# Patient Record
Sex: Female | Born: 1965
Health system: Southern US, Community
[De-identification: ages and names within clinical notes are randomized; demographics above are authoritative.]

## PROBLEM LIST (undated history)

## (undated) DIAGNOSIS — M1990 Unspecified osteoarthritis, unspecified site: Secondary | ICD-10-CM

## (undated) DIAGNOSIS — E7800 Pure hypercholesterolemia, unspecified: Secondary | ICD-10-CM

## (undated) DIAGNOSIS — L409 Psoriasis, unspecified: Secondary | ICD-10-CM

## (undated) DIAGNOSIS — E119 Type 2 diabetes mellitus without complications: Secondary | ICD-10-CM

## (undated) DIAGNOSIS — I10 Essential (primary) hypertension: Secondary | ICD-10-CM

## (undated) HISTORY — PX: ABDOMINAL HYSTERECTOMY: SHX81

---

## 2008-12-11 ENCOUNTER — Ambulatory Visit: Payer: Self-pay | Admitting: Family

## 2008-12-11 DIAGNOSIS — E785 Hyperlipidemia, unspecified: Secondary | ICD-10-CM | POA: Insufficient documentation

## 2008-12-11 DIAGNOSIS — E669 Obesity, unspecified: Secondary | ICD-10-CM | POA: Insufficient documentation

## 2008-12-11 DIAGNOSIS — K644 Residual hemorrhoidal skin tags: Secondary | ICD-10-CM | POA: Insufficient documentation

## 2008-12-11 DIAGNOSIS — M7020 Olecranon bursitis, unspecified elbow: Secondary | ICD-10-CM | POA: Insufficient documentation

## 2008-12-11 DIAGNOSIS — I10 Essential (primary) hypertension: Secondary | ICD-10-CM | POA: Insufficient documentation

## 2008-12-11 DIAGNOSIS — E119 Type 2 diabetes mellitus without complications: Secondary | ICD-10-CM | POA: Insufficient documentation

## 2008-12-11 DIAGNOSIS — R300 Dysuria: Secondary | ICD-10-CM | POA: Insufficient documentation

## 2008-12-11 LAB — CONVERTED CEMR LAB
ALT: 14 units/L (ref 0–35)
AST: 13 units/L (ref 0–37)
Basophils Relative: 0 % (ref 0–1)
Bilirubin Urine: NEGATIVE
CO2: 19 meq/L (ref 19–32)
Chloride: 105 meq/L (ref 96–112)
Glucose, Bld: 105 mg/dL — ABNORMAL HIGH (ref 70–99)
Hemoglobin: 13.2 g/dL (ref 12.0–15.0)
Ketones, ur: NEGATIVE mg/dL
LDL Cholesterol: 69 mg/dL (ref 0–99)
Lymphocytes Relative: 24 % (ref 12–46)
Lymphs Abs: 2.9 10*3/uL (ref 0.7–4.0)
MCHC: 32.8 g/dL (ref 30.0–36.0)
Monocytes Absolute: 0.8 10*3/uL (ref 0.1–1.0)
Monocytes Relative: 7 % (ref 3–12)
Neutro Abs: 8 10*3/uL — ABNORMAL HIGH (ref 1.7–7.7)
Potassium: 4 meq/L (ref 3.5–5.3)
Protein, ur: NEGATIVE mg/dL
RBC: 4.73 M/uL (ref 3.87–5.11)
Sodium: 137 meq/L (ref 135–145)
Total CHOL/HDL Ratio: 3.5
Urine Glucose: NEGATIVE mg/dL
Urobilinogen, UA: 0.2 (ref 0.0–1.0)
VLDL: 36 mg/dL (ref 0–40)

## 2008-12-12 ENCOUNTER — Encounter: Payer: Self-pay | Admitting: Family

## 2008-12-12 ENCOUNTER — Encounter (INDEPENDENT_AMBULATORY_CARE_PROVIDER_SITE_OTHER): Payer: Self-pay | Admitting: *Deleted

## 2008-12-18 ENCOUNTER — Encounter: Payer: Self-pay | Admitting: Internal Medicine

## 2008-12-19 ENCOUNTER — Ambulatory Visit: Payer: Self-pay | Admitting: Diagnostic Radiology

## 2008-12-19 ENCOUNTER — Ambulatory Visit (HOSPITAL_BASED_OUTPATIENT_CLINIC_OR_DEPARTMENT_OTHER): Admission: RE | Admit: 2008-12-19 | Discharge: 2008-12-19 | Payer: Self-pay | Admitting: Internal Medicine

## 2008-12-21 ENCOUNTER — Encounter: Admission: RE | Admit: 2008-12-21 | Discharge: 2008-12-26 | Payer: Self-pay | Admitting: Orthopedic Surgery

## 2009-05-10 ENCOUNTER — Telehealth: Payer: Self-pay | Admitting: Family

## 2009-05-29 ENCOUNTER — Ambulatory Visit: Payer: Self-pay | Admitting: Internal Medicine

## 2010-03-12 NOTE — Progress Notes (Signed)
Summary: refill-simvastatin, appt?  Phone Note Refill Request Message from:  Fax from Pharmacy on May 10, 2009 8:12 AM  Refills Requested: Medication #1:  SIMVASTATIN 20 MG TABS once daily   Dosage confirmed as above?Dosage Confirmed   Brand Name Necessary? No   Supply Requested: 1 month   Last Refilled: [facility] [address] [place] [phone] FAX [phone]   Method Requested: Electronic Next Appointment Scheduled: NONE Initial call taken by: Roselle Locus,  May 10, 2009 8:13 AM  Follow-up for Phone Call        Pt. does not have a f/u scheduled. When do you want to see her again?  I gave her a 30 day supply on simvastatin.  Mervin Kung CMA  May 10, 2009 1:00 PM   Additional Follow-up for Phone Call Additional follow up Details #1::        she should follow up soon pls.  We need to check on her BP.    Additional Follow-up for Phone Call Additional follow up Details #2::    Unable to reach pt. at home, unable to leave message.  Advised pharmacist to leave note for pt. to call our office.  Mervin Kung CMA  May 11, 2009 8:28 AM   Spoke to pt. and scheduled f/u of BP on 05/16/09 with David Towson @ 9:30. Nicki Guadalajara Fergerson CMA  May 14, 2009 10:52 AM   Prescriptions: SIMVASTATIN 20 MG TABS (SIMVASTATIN) once daily  #30 x 0   Entered by:   Mervin Kung CMA   Authorized by:   Lemont Fillers FNP   Signed by:   Mervin Kung CMA on 05/10/2009   Method used:   [facility]ronically to        [facility] [phone]* (retail)       [address]       [place], [place]  [address]       Ph: [phone] or [phone]       Fax: [phone]   RxID:   [id number]   Current Allergies: No known allergies

## 2010-09-28 IMAGING — MG MM DIGITAL SCREENING
5 series · 5 of 5 positions shown · non-contrast
Comparison: none

DG SCREEN MAMMOGRAM BILATERAL
Bilateral CC and MLO view(s) were taken.
Technologist: Wenchih Navisa, RT (R)(M)

DIGITAL SCREENING MAMMOGRAM WITH CAD:
There are scattered fibroglandular densities.  No masses or malignant type calcifications are 
identified.  Compared with prior studies.
Images were processed with CAD.

[R CC]
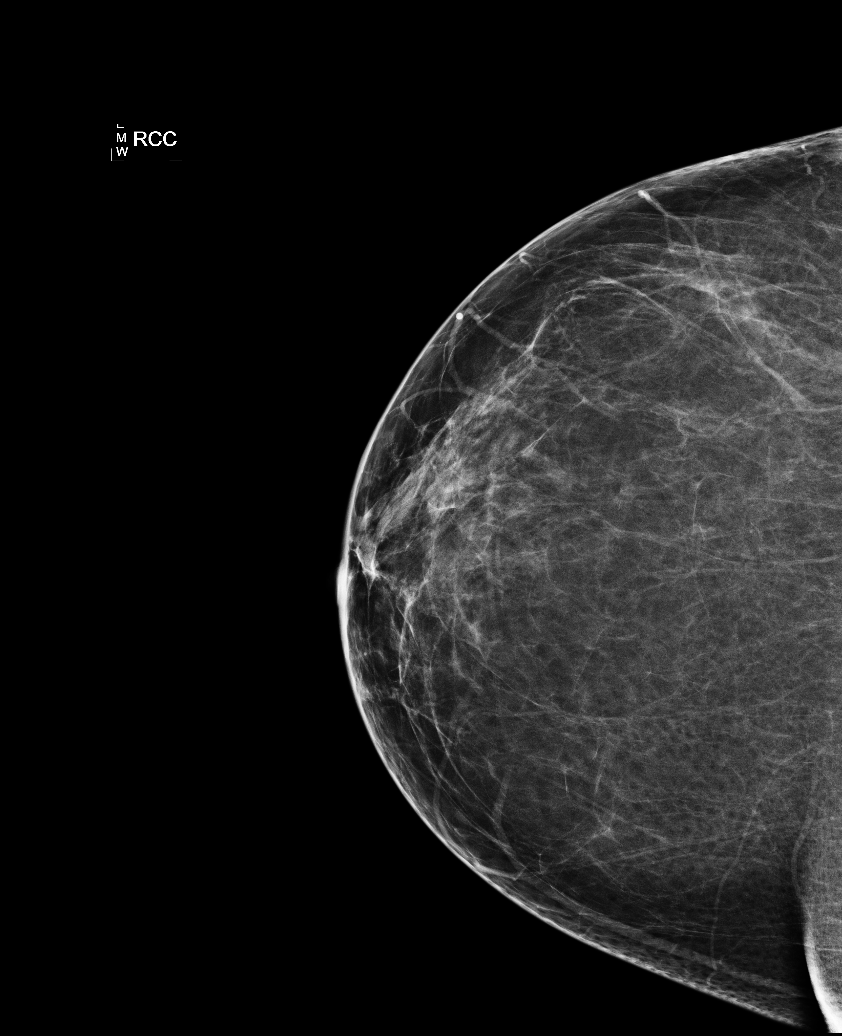

[L CC]
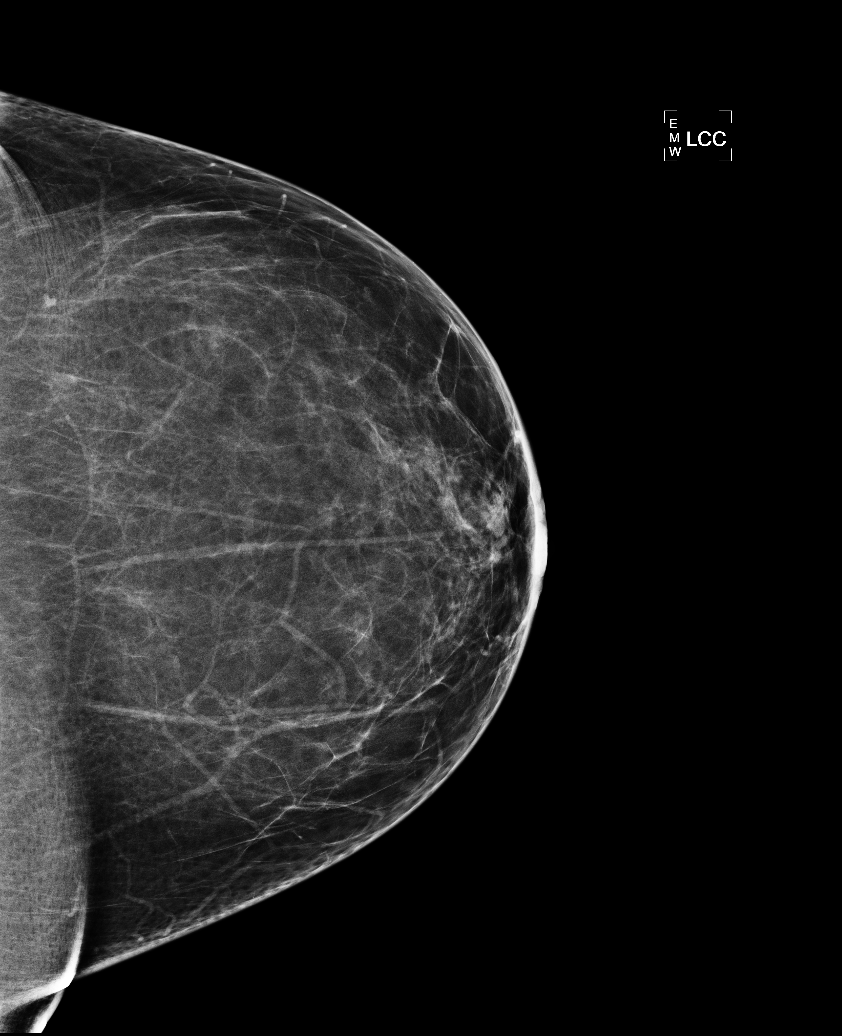

[L MLO]
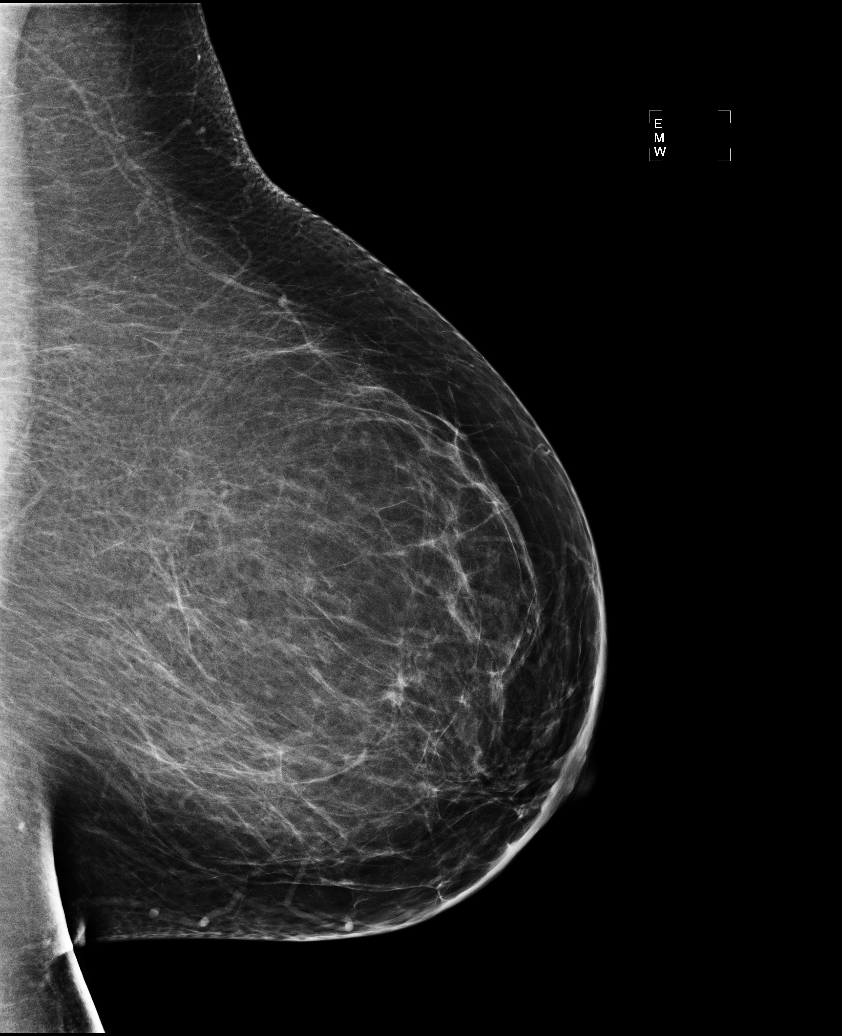

[R MLO]
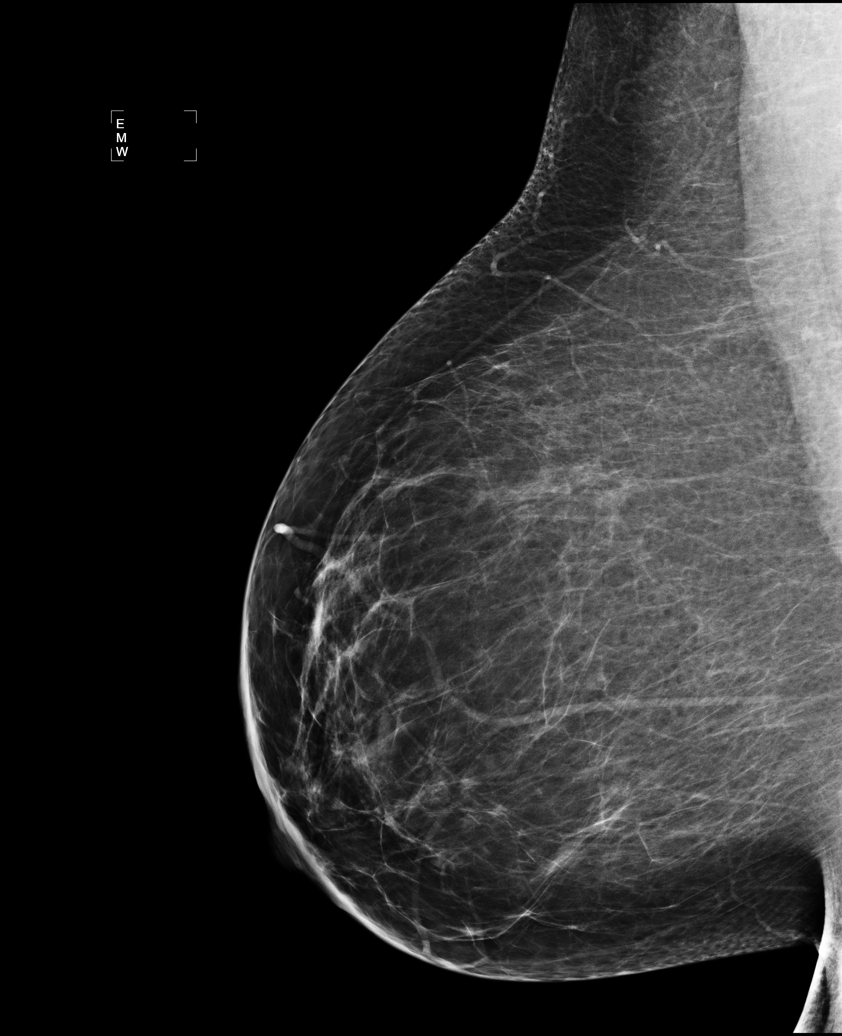

[R CV]
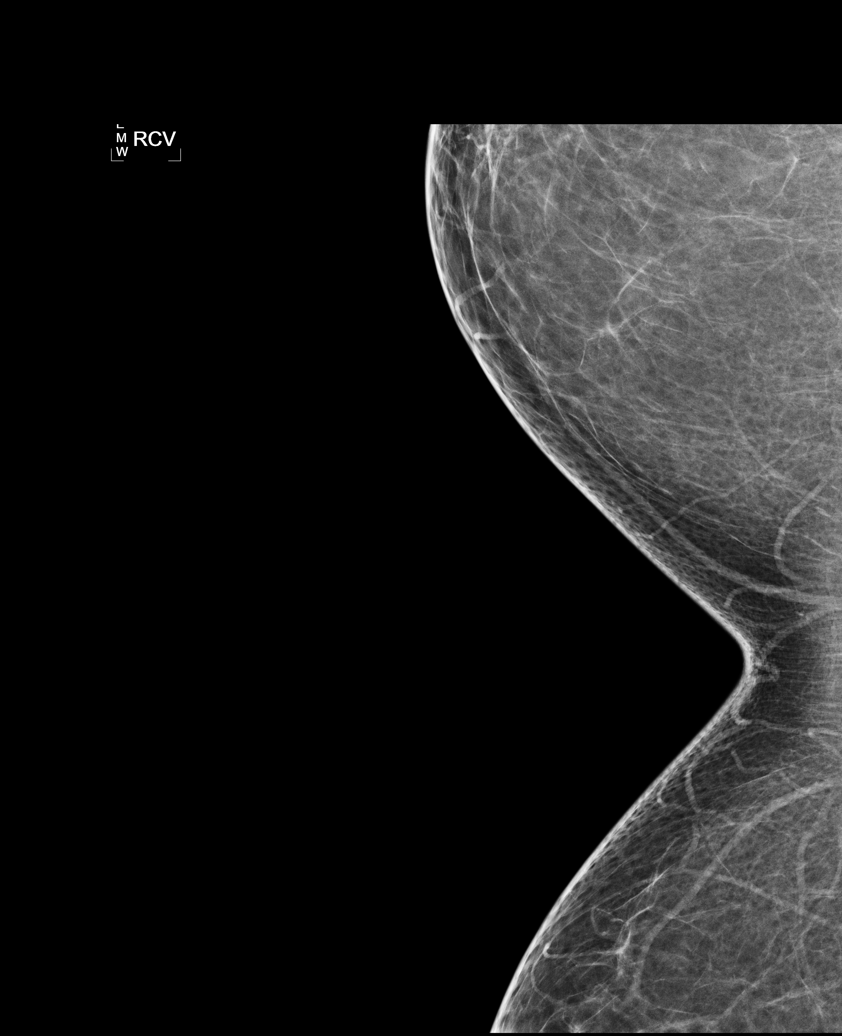

[5 of 5 positions shown; findings below may reference images not displayed]

IMPRESSION: No specific mammographic evidence of malignancy.  Next screening mammogram is recommended in one 
year.

A result letter of this screening mammogram will be mailed directly to the patient.

ASSESSMENT: Negative - BI-RADS 1

Screening mammogram in 1 year.
,

## 2015-05-30 DIAGNOSIS — L400 Psoriasis vulgaris: Secondary | ICD-10-CM | POA: Diagnosis not present

## 2015-06-04 DIAGNOSIS — M5413 Radiculopathy, cervicothoracic region: Secondary | ICD-10-CM | POA: Diagnosis not present

## 2015-06-04 DIAGNOSIS — M546 Pain in thoracic spine: Secondary | ICD-10-CM | POA: Diagnosis not present

## 2015-06-04 DIAGNOSIS — M5033 Other cervical disc degeneration, cervicothoracic region: Secondary | ICD-10-CM | POA: Diagnosis not present

## 2015-06-06 DIAGNOSIS — M546 Pain in thoracic spine: Secondary | ICD-10-CM | POA: Diagnosis not present

## 2015-06-06 DIAGNOSIS — M5413 Radiculopathy, cervicothoracic region: Secondary | ICD-10-CM | POA: Diagnosis not present

## 2015-06-06 DIAGNOSIS — M5033 Other cervical disc degeneration, cervicothoracic region: Secondary | ICD-10-CM | POA: Diagnosis not present

## 2015-06-07 DIAGNOSIS — M5413 Radiculopathy, cervicothoracic region: Secondary | ICD-10-CM | POA: Diagnosis not present

## 2015-06-07 DIAGNOSIS — M5033 Other cervical disc degeneration, cervicothoracic region: Secondary | ICD-10-CM | POA: Diagnosis not present

## 2015-06-07 DIAGNOSIS — M546 Pain in thoracic spine: Secondary | ICD-10-CM | POA: Diagnosis not present

## 2015-06-08 DIAGNOSIS — M5413 Radiculopathy, cervicothoracic region: Secondary | ICD-10-CM | POA: Diagnosis not present

## 2015-06-08 DIAGNOSIS — M5033 Other cervical disc degeneration, cervicothoracic region: Secondary | ICD-10-CM | POA: Diagnosis not present

## 2015-06-08 DIAGNOSIS — M546 Pain in thoracic spine: Secondary | ICD-10-CM | POA: Diagnosis not present

## 2015-06-29 DIAGNOSIS — M79672 Pain in left foot: Secondary | ICD-10-CM | POA: Diagnosis not present

## 2015-07-02 DIAGNOSIS — M79672 Pain in left foot: Secondary | ICD-10-CM | POA: Diagnosis not present

## 2015-07-25 DIAGNOSIS — L400 Psoriasis vulgaris: Secondary | ICD-10-CM | POA: Diagnosis not present

## 2015-07-25 DIAGNOSIS — B360 Pityriasis versicolor: Secondary | ICD-10-CM | POA: Diagnosis not present

## 2015-08-01 DIAGNOSIS — E782 Mixed hyperlipidemia: Secondary | ICD-10-CM | POA: Diagnosis not present

## 2015-08-01 DIAGNOSIS — M5033 Other cervical disc degeneration, cervicothoracic region: Secondary | ICD-10-CM | POA: Diagnosis not present

## 2015-08-01 DIAGNOSIS — M5413 Radiculopathy, cervicothoracic region: Secondary | ICD-10-CM | POA: Diagnosis not present

## 2015-08-01 DIAGNOSIS — M546 Pain in thoracic spine: Secondary | ICD-10-CM | POA: Diagnosis not present

## 2015-08-01 DIAGNOSIS — E119 Type 2 diabetes mellitus without complications: Secondary | ICD-10-CM | POA: Diagnosis not present

## 2015-08-01 DIAGNOSIS — I10 Essential (primary) hypertension: Secondary | ICD-10-CM | POA: Diagnosis not present

## 2015-08-13 DIAGNOSIS — E782 Mixed hyperlipidemia: Secondary | ICD-10-CM | POA: Diagnosis not present

## 2015-08-13 DIAGNOSIS — R748 Abnormal levels of other serum enzymes: Secondary | ICD-10-CM | POA: Diagnosis not present

## 2015-08-13 DIAGNOSIS — E1129 Type 2 diabetes mellitus with other diabetic kidney complication: Secondary | ICD-10-CM | POA: Diagnosis not present

## 2015-08-13 DIAGNOSIS — I10 Essential (primary) hypertension: Secondary | ICD-10-CM | POA: Diagnosis not present

## 2015-08-14 ENCOUNTER — Emergency Department (HOSPITAL_BASED_OUTPATIENT_CLINIC_OR_DEPARTMENT_OTHER)
Admission: EM | Admit: 2015-08-14 | Discharge: 2015-08-14 | Disposition: A | Discharge status: Home / Self Care | Payer: Self-pay | Attending: Emergency Medicine | Admitting: Emergency Medicine

## 2015-08-14 ENCOUNTER — Encounter (HOSPITAL_BASED_OUTPATIENT_CLINIC_OR_DEPARTMENT_OTHER): Payer: Self-pay | Admitting: *Deleted

## 2015-08-14 DIAGNOSIS — Z7982 Long term (current) use of aspirin: Secondary | ICD-10-CM | POA: Insufficient documentation

## 2015-08-14 DIAGNOSIS — L03011 Cellulitis of right finger: Secondary | ICD-10-CM | POA: Diagnosis not present

## 2015-08-14 DIAGNOSIS — E7800 Pure hypercholesterolemia, unspecified: Secondary | ICD-10-CM | POA: Insufficient documentation

## 2015-08-14 DIAGNOSIS — I10 Essential (primary) hypertension: Secondary | ICD-10-CM | POA: Insufficient documentation

## 2015-08-14 DIAGNOSIS — Z7984 Long term (current) use of oral hypoglycemic drugs: Secondary | ICD-10-CM | POA: Insufficient documentation

## 2015-08-14 DIAGNOSIS — E119 Type 2 diabetes mellitus without complications: Secondary | ICD-10-CM | POA: Insufficient documentation

## 2015-08-14 DIAGNOSIS — Z79899 Other long term (current) drug therapy: Secondary | ICD-10-CM | POA: Insufficient documentation

## 2015-08-14 HISTORY — DX: Pure hypercholesterolemia, unspecified: E78.00

## 2015-08-14 HISTORY — DX: Type 2 diabetes mellitus without complications: E11.9

## 2015-08-14 HISTORY — DX: Essential (primary) hypertension: I10

## 2015-08-14 HISTORY — DX: Psoriasis, unspecified: L40.9

## 2015-08-14 MED ORDER — LIDOCAINE HCL 2 % IJ SOLN
10.0000 mL | Freq: Once | INTRAMUSCULAR | Status: AC
  Administered 2015-08-14: 200 mg
  Filled 2015-08-14: qty 20

## 2015-08-14 MED ORDER — IBUPROFEN 400 MG PO TABS
600.0000 mg | ORAL_TABLET | Freq: Once | ORAL | Status: AC
  Administered 2015-08-14: 600 mg via ORAL
  Filled 2015-08-14: qty 1

## 2015-08-14 MED ORDER — CEPHALEXIN 500 MG PO CAPS: 500.0000 mg | ORAL_CAPSULE | Freq: Three times a day (TID) | ORAL | Status: DC

## 2015-08-14 MED ORDER — CEPHALEXIN 250 MG PO CAPS
500.0000 mg | ORAL_CAPSULE | Freq: Once | ORAL | Status: AC
  Administered 2015-08-14: 500 mg via ORAL
  Filled 2015-08-14: qty 2

## 2015-08-14 NOTE — Discharge Instructions (Signed)
Paronychia °Paronychia is an infection of the skin that surrounds a nail. It usually affects the skin around a fingernail, but it may also occur near a toenail. It often causes pain and swelling around the nail. This condition may come on suddenly or develop over a longer period. In some cases, a collection of pus (abscess) can form near or under the nail. Usually, paronychia is not serious and it clears up with treatment. °CAUSES °This condition may be caused by bacteria or fungi. It is commonly caused by either Streptococcus or Staphylococcus bacteria. The bacteria or fungi often cause the infection by getting into the affected area through an opening in the skin, such as a cut or a hangnail. °RISK FACTORS °This condition is more likely to develop in: °· People who get their hands wet often, such as those who work as dishwashers, bartenders, or nurses. °· People who bite their fingernails or suck their thumbs. °· People who trim their nails too short. °· People who have hangnails or injured fingertips. °· People who get manicures. °· People who have diabetes. °SYMPTOMS °Symptoms of this condition include: °· Redness and swelling of the skin near the nail. °· Tenderness around the nail when you touch the area. °· Pus-filled bumps under the cuticle. The cuticle is the skin at the base or sides of the nail. °· Fluid or pus under the nail. °· Throbbing pain in the area. °DIAGNOSIS °This condition is usually diagnosed with a physical exam. In some cases, a sample of pus may be taken from an abscess to be tested in a lab. This can help to determine what type of bacteria or fungi is causing the condition. °TREATMENT °Treatment for this condition depends on the cause and severity of the condition. If the condition is mild, it may clear up on its own in a few days. Your health care provider may recommend soaking the affected area in warm water a few times a day. When treatment is needed, the options may  include: °· Antibiotic medicine, if the condition is caused by a bacterial infection. °· Antifungal medicine, if the condition is caused by a fungal infection. °· Incision and drainage, if an abscess is present. In this procedure, the health care provider will cut open the abscess so the pus can drain out. °HOME CARE INSTRUCTIONS °· Soak the affected area in warm water if directed to do so by your health care provider. You may be told to do this for 20 minutes, 2-3 times a day. Keep the area dry in between soakings. °· Take medicines only as directed by your health care provider. °· If you were prescribed an antibiotic medicine, finish all of it even if you start to feel better. °· Keep the affected area clean. °· Do not try to drain a fluid-filled bump yourself. °· If you will be washing dishes or performing other tasks that require your hands to get wet, wear rubber gloves. You should also wear gloves if your hands might come in contact with irritating substances, such as cleaners or chemicals. °· Follow your health care provider's instructions about: °¨ Wound care. °¨ Bandage (dressing) changes and removal. °SEEK MEDICAL CARE IF: °· Your symptoms get worse or do not improve with treatment. °· You have a fever or chills. °· You have redness spreading from the affected area. °· You have continued or increased fluid, blood, or pus coming from the affected area. °· Your finger or knuckle becomes swollen or is difficult to move. °  °  This information is not intended to replace advice given to you by your health care provider. Make sure you discuss any questions you have with your health care provider. °  °Document Released: 07/23/2000 Document Revised: 06/13/2014 Document Reviewed: 01/04/2014 °Elsevier Interactive Patient Education ©2016 [employer] ° °

## 2015-08-14 NOTE — ED Notes (Signed)
"  feel better", NAD, calm, dressing CDI, given Rx x1, out with family, denies questions or needs, requesting meds prior to d/c

## 2015-08-14 NOTE — ED Provider Notes (Signed)
CSN: [id number]     Arrival date & time 08/14/15  2032 History  By signing my name below, I, Alyssa GroveMartin Green, attest that this documentation has been prepared under the direction and in the presence of Lyndal Pulleyaniel Sarita Hakanson, MD. Electronically Signed: Alyssa GroveMartin Green, ED Scribe. 08/14/2015. 9:15 PM.    Chief Complaint  Patient presents with  . Finger Injury    The history is provided by the patient. No language interpreter was used.    HPI Comments: Deborah Salas is a 50 y.o. female who presents to the Emergency Department complaining of gradual onset and worsening swelling of the third digit of the right hand onset a few days. Pt reports associated finger pain.  Past Medical History  Diagnosis Date  . Diabetes mellitus without complication (HCC)   . Hypertension   . Hypercholesterolemia   . Psoriasis    History reviewed. No pertinent past surgical history. History reviewed. No pertinent family history. Social History  Substance Use Topics  . Smoking status: Never Smoker   . Smokeless tobacco: None  . Alcohol Use: No   OB History    No data available     Review of Systems  Constitutional: Negative for fever.  Skin: Positive for color change.  All other systems reviewed and are negative.   Allergies  Review of patient's allergies indicates no known allergies.  Home Medications   Prior to Admission medications   Medication Sig Start Date End Date Taking? Authorizing Provider  aspirin 81 MG tablet Take 81 mg by mouth daily.   Yes Historical Provider, MD  Liraglutide (VICTOZA [place]) Inject into the skin.   Yes Historical Provider, MD  METFORMIN HCL PO Take by mouth.   Yes Historical Provider, MD  SIMVASTATIN PO Take by mouth.   Yes Historical Provider, MD   BP 125/74 mmHg  Pulse 88  Temp(Src) 97.8 F (36.6 C) (Oral)  Resp 18  Ht 5\' 6"  (1.676 m)  Wt 218 lb (98.884 kg)  BMI 35.20 kg/m2  SpO2 100% Physical Exam  Constitutional: She appears well-developed and well-nourished.   HENT:  Head: Normocephalic.  Eyes: Conjunctivae are normal.  Cardiovascular: Normal rate.   Pulmonary/Chest: Effort normal. No respiratory distress.  Abdominal: She exhibits no distension.  Musculoskeletal: Normal range of motion.  Neurological: She is alert.  Skin: Skin is warm and dry.  Paronychia on ulnar aspect of the third digit of the right hand  Psychiatric: She has a normal mood and affect. Her behavior is normal.  Nursing note and vitals reviewed.   ED Course  .Nerve Block Date/Time: 08/14/2015 9:37 PM Performed by: Lyndal PulleyKNOTT, Chayil Gantt Authorized by: Lyndal PulleyKNOTT, Gatlin Kittell Consent: Verbal consent obtained. Risks and benefits: risks, benefits and alternatives were discussed Consent given by: patient Patient understanding: patient states understanding of the procedure being performed Patient identity confirmed: verbally with patient, arm band, provided demographic data and hospital-assigned identification number Indications: pain relief and debridement Body area: upper extremity Nerve: digital Laterality: right Patient sedated: no Patient position: sitting Needle gauge: 25 G Location technique: anatomical landmarks Local anesthetic: lidocaine 2% without epinephrine Anesthetic total: 8 ml Outcome: pain improved Patient tolerance: Patient tolerated the procedure well with no immediate complications   (including critical care time)  INCISION AND DRAINAGE Performed by: Lyndal PulleyKnott, Alyssa Rotondo Consent: Verbal consent obtained. Risks and benefits: risks, benefits and alternatives were discussed Type: abscess  Body area: right middle finger nail fold  Anesthesia: local infiltration  Incision was made with a scalpel.  Local anesthetic: lidocaine 2% wo  epinephrine  Anesthetic total: 8 ml  Complexity: complex Blunt dissection to break up loculations  Drainage: purulent  Drainage amount: moderate  Packing material: none  Patient tolerance: Patient tolerated the procedure well with  no immediate complications.      DIAGNOSTIC STUDIES: Oxygen Saturation is 100% on RA, normal by my interpretation.    COORDINATION OF CARE: 9:12 PM Discussed treatment plan with pt at bedside which includes Lidocaine and drainage and pt agreed to plan.    EKG Interpretation None      MDM   Final diagnoses:  Paronychia of finger of right hand    50 year old female presents with paronychia of her right long finger on the ulnar side. Drained as above with appropriate expression of purulent material. Will place on antibiotics due to mild extending cellulitis and history of diabetes. Plan to follow up with PCP as needed and return precautions discussed for worsening or new concerning symptoms.   I personally performed the services described in this documentation, which was scribed in my presence. The recorded information has been reviewed and is accurate.    Lyndal Pulleyaniel Jacqualine Weichel, MD 08/14/15 2139

## 2015-08-14 NOTE — ED Notes (Addendum)
Recent trip to [place], upon return developed parronychia in R middle finger, noticed Saturday, c/o pain/throbbing, swelling, redness, (denies: known injury, fever, nvd, cramping), h/o DM, "BS usually run 110, recent A1C was 5.8", PCP in [place]. No meds PTA. Alert, NAD, calm, here with family, primary language Spanish, speaks [place]. Unknown Tdap.

## 2015-09-03 DIAGNOSIS — M5413 Radiculopathy, cervicothoracic region: Secondary | ICD-10-CM | POA: Diagnosis not present

## 2015-09-03 DIAGNOSIS — M546 Pain in thoracic spine: Secondary | ICD-10-CM | POA: Diagnosis not present

## 2015-09-03 DIAGNOSIS — Z6835 Body mass index (BMI) 35.0-35.9, adult: Secondary | ICD-10-CM | POA: Diagnosis not present

## 2015-09-03 DIAGNOSIS — R238 Other skin changes: Secondary | ICD-10-CM | POA: Diagnosis not present

## 2015-09-03 DIAGNOSIS — M5033 Other cervical disc degeneration, cervicothoracic region: Secondary | ICD-10-CM | POA: Diagnosis not present

## 2015-09-03 DIAGNOSIS — Z1389 Encounter for screening for other disorder: Secondary | ICD-10-CM | POA: Diagnosis not present

## 2015-09-13 DIAGNOSIS — R748 Abnormal levels of other serum enzymes: Secondary | ICD-10-CM | POA: Diagnosis not present

## 2015-09-19 DIAGNOSIS — L089 Local infection of the skin and subcutaneous tissue, unspecified: Secondary | ICD-10-CM | POA: Diagnosis not present

## 2015-09-19 DIAGNOSIS — Z6835 Body mass index (BMI) 35.0-35.9, adult: Secondary | ICD-10-CM | POA: Diagnosis not present

## 2015-10-24 DIAGNOSIS — M546 Pain in thoracic spine: Secondary | ICD-10-CM | POA: Diagnosis not present

## 2015-10-24 DIAGNOSIS — M5413 Radiculopathy, cervicothoracic region: Secondary | ICD-10-CM | POA: Diagnosis not present

## 2015-10-24 DIAGNOSIS — M5033 Other cervical disc degeneration, cervicothoracic region: Secondary | ICD-10-CM | POA: Diagnosis not present

## 2015-10-30 DIAGNOSIS — E119 Type 2 diabetes mellitus without complications: Secondary | ICD-10-CM | POA: Diagnosis not present

## 2015-11-29 DIAGNOSIS — Z23 Encounter for immunization: Secondary | ICD-10-CM | POA: Diagnosis not present

## 2015-12-18 DIAGNOSIS — E119 Type 2 diabetes mellitus without complications: Secondary | ICD-10-CM | POA: Diagnosis not present

## 2015-12-18 DIAGNOSIS — I10 Essential (primary) hypertension: Secondary | ICD-10-CM | POA: Diagnosis not present

## 2015-12-18 DIAGNOSIS — E782 Mixed hyperlipidemia: Secondary | ICD-10-CM | POA: Diagnosis not present

## 2015-12-31 DIAGNOSIS — E119 Type 2 diabetes mellitus without complications: Secondary | ICD-10-CM | POA: Diagnosis not present

## 2015-12-31 DIAGNOSIS — I10 Essential (primary) hypertension: Secondary | ICD-10-CM | POA: Diagnosis not present

## 2015-12-31 DIAGNOSIS — M5033 Other cervical disc degeneration, cervicothoracic region: Secondary | ICD-10-CM | POA: Diagnosis not present

## 2015-12-31 DIAGNOSIS — E782 Mixed hyperlipidemia: Secondary | ICD-10-CM | POA: Diagnosis not present

## 2015-12-31 DIAGNOSIS — M546 Pain in thoracic spine: Secondary | ICD-10-CM | POA: Diagnosis not present

## 2015-12-31 DIAGNOSIS — M5413 Radiculopathy, cervicothoracic region: Secondary | ICD-10-CM | POA: Diagnosis not present

## 2015-12-31 DIAGNOSIS — Z6835 Body mass index (BMI) 35.0-35.9, adult: Secondary | ICD-10-CM | POA: Diagnosis not present

## 2016-02-01 DIAGNOSIS — Z1231 Encounter for screening mammogram for malignant neoplasm of breast: Secondary | ICD-10-CM | POA: Diagnosis not present

## 2016-04-02 DIAGNOSIS — Z79899 Other long term (current) drug therapy: Secondary | ICD-10-CM | POA: Diagnosis not present

## 2016-04-02 DIAGNOSIS — L400 Psoriasis vulgaris: Secondary | ICD-10-CM | POA: Diagnosis not present

## 2016-04-17 DIAGNOSIS — M5033 Other cervical disc degeneration, cervicothoracic region: Secondary | ICD-10-CM | POA: Diagnosis not present

## 2016-04-17 DIAGNOSIS — M5413 Radiculopathy, cervicothoracic region: Secondary | ICD-10-CM | POA: Diagnosis not present

## 2016-04-17 DIAGNOSIS — M546 Pain in thoracic spine: Secondary | ICD-10-CM | POA: Diagnosis not present

## 2016-05-05 DIAGNOSIS — M5033 Other cervical disc degeneration, cervicothoracic region: Secondary | ICD-10-CM | POA: Diagnosis not present

## 2016-05-05 DIAGNOSIS — M5413 Radiculopathy, cervicothoracic region: Secondary | ICD-10-CM | POA: Diagnosis not present

## 2016-05-05 DIAGNOSIS — M546 Pain in thoracic spine: Secondary | ICD-10-CM | POA: Diagnosis not present

## 2016-05-07 DIAGNOSIS — E119 Type 2 diabetes mellitus without complications: Secondary | ICD-10-CM | POA: Diagnosis not present

## 2016-05-07 DIAGNOSIS — E782 Mixed hyperlipidemia: Secondary | ICD-10-CM | POA: Diagnosis not present

## 2016-05-07 DIAGNOSIS — I10 Essential (primary) hypertension: Secondary | ICD-10-CM | POA: Diagnosis not present

## 2016-05-13 DIAGNOSIS — M5413 Radiculopathy, cervicothoracic region: Secondary | ICD-10-CM | POA: Diagnosis not present

## 2016-05-13 DIAGNOSIS — M546 Pain in thoracic spine: Secondary | ICD-10-CM | POA: Diagnosis not present

## 2016-05-13 DIAGNOSIS — M5033 Other cervical disc degeneration, cervicothoracic region: Secondary | ICD-10-CM | POA: Diagnosis not present

## 2016-05-14 DIAGNOSIS — M5413 Radiculopathy, cervicothoracic region: Secondary | ICD-10-CM | POA: Diagnosis not present

## 2016-05-14 DIAGNOSIS — E782 Mixed hyperlipidemia: Secondary | ICD-10-CM | POA: Diagnosis not present

## 2016-05-14 DIAGNOSIS — M5033 Other cervical disc degeneration, cervicothoracic region: Secondary | ICD-10-CM | POA: Diagnosis not present

## 2016-05-14 DIAGNOSIS — I10 Essential (primary) hypertension: Secondary | ICD-10-CM | POA: Diagnosis not present

## 2016-05-14 DIAGNOSIS — M546 Pain in thoracic spine: Secondary | ICD-10-CM | POA: Diagnosis not present

## 2016-05-14 DIAGNOSIS — E119 Type 2 diabetes mellitus without complications: Secondary | ICD-10-CM | POA: Diagnosis not present

## 2016-06-12 DIAGNOSIS — M546 Pain in thoracic spine: Secondary | ICD-10-CM | POA: Diagnosis not present

## 2016-06-12 DIAGNOSIS — M5033 Other cervical disc degeneration, cervicothoracic region: Secondary | ICD-10-CM | POA: Diagnosis not present

## 2016-06-12 DIAGNOSIS — M5413 Radiculopathy, cervicothoracic region: Secondary | ICD-10-CM | POA: Diagnosis not present

## 2016-06-20 DIAGNOSIS — M5413 Radiculopathy, cervicothoracic region: Secondary | ICD-10-CM | POA: Diagnosis not present

## 2016-06-20 DIAGNOSIS — M5033 Other cervical disc degeneration, cervicothoracic region: Secondary | ICD-10-CM | POA: Diagnosis not present

## 2016-06-20 DIAGNOSIS — M546 Pain in thoracic spine: Secondary | ICD-10-CM | POA: Diagnosis not present

## 2016-08-05 DIAGNOSIS — L400 Psoriasis vulgaris: Secondary | ICD-10-CM | POA: Diagnosis not present

## 2016-08-05 DIAGNOSIS — Z79899 Other long term (current) drug therapy: Secondary | ICD-10-CM | POA: Diagnosis not present

## 2016-08-19 DIAGNOSIS — M546 Pain in thoracic spine: Secondary | ICD-10-CM | POA: Diagnosis not present

## 2016-08-19 DIAGNOSIS — M5033 Other cervical disc degeneration, cervicothoracic region: Secondary | ICD-10-CM | POA: Diagnosis not present

## 2016-08-19 DIAGNOSIS — M5413 Radiculopathy, cervicothoracic region: Secondary | ICD-10-CM | POA: Diagnosis not present

## 2016-09-05 DIAGNOSIS — M546 Pain in thoracic spine: Secondary | ICD-10-CM | POA: Diagnosis not present

## 2016-09-05 DIAGNOSIS — M5033 Other cervical disc degeneration, cervicothoracic region: Secondary | ICD-10-CM | POA: Diagnosis not present

## 2016-09-05 DIAGNOSIS — M5413 Radiculopathy, cervicothoracic region: Secondary | ICD-10-CM | POA: Diagnosis not present

## 2016-09-12 DIAGNOSIS — E119 Type 2 diabetes mellitus without complications: Secondary | ICD-10-CM | POA: Diagnosis not present

## 2016-09-12 DIAGNOSIS — I10 Essential (primary) hypertension: Secondary | ICD-10-CM | POA: Diagnosis not present

## 2016-09-12 DIAGNOSIS — E782 Mixed hyperlipidemia: Secondary | ICD-10-CM | POA: Diagnosis not present

## 2016-09-18 DIAGNOSIS — M546 Pain in thoracic spine: Secondary | ICD-10-CM | POA: Diagnosis not present

## 2016-09-18 DIAGNOSIS — I10 Essential (primary) hypertension: Secondary | ICD-10-CM | POA: Diagnosis not present

## 2016-09-18 DIAGNOSIS — E782 Mixed hyperlipidemia: Secondary | ICD-10-CM | POA: Diagnosis not present

## 2016-09-18 DIAGNOSIS — Z6834 Body mass index (BMI) 34.0-34.9, adult: Secondary | ICD-10-CM | POA: Diagnosis not present

## 2016-09-18 DIAGNOSIS — E119 Type 2 diabetes mellitus without complications: Secondary | ICD-10-CM | POA: Diagnosis not present

## 2016-09-18 DIAGNOSIS — M5033 Other cervical disc degeneration, cervicothoracic region: Secondary | ICD-10-CM | POA: Diagnosis not present

## 2016-09-18 DIAGNOSIS — M5413 Radiculopathy, cervicothoracic region: Secondary | ICD-10-CM | POA: Diagnosis not present

## 2016-10-08 DIAGNOSIS — M5033 Other cervical disc degeneration, cervicothoracic region: Secondary | ICD-10-CM | POA: Diagnosis not present

## 2016-10-08 DIAGNOSIS — M5413 Radiculopathy, cervicothoracic region: Secondary | ICD-10-CM | POA: Diagnosis not present

## 2016-10-08 DIAGNOSIS — M5442 Lumbago with sciatica, left side: Secondary | ICD-10-CM | POA: Diagnosis not present

## 2016-10-08 DIAGNOSIS — M546 Pain in thoracic spine: Secondary | ICD-10-CM | POA: Diagnosis not present

## 2016-10-16 DIAGNOSIS — L400 Psoriasis vulgaris: Secondary | ICD-10-CM | POA: Diagnosis not present

## 2016-11-04 DIAGNOSIS — M216X1 Other acquired deformities of right foot: Secondary | ICD-10-CM | POA: Diagnosis not present

## 2016-11-04 DIAGNOSIS — M2142 Flat foot [pes planus] (acquired), left foot: Secondary | ICD-10-CM | POA: Diagnosis not present

## 2016-11-04 DIAGNOSIS — M2141 Flat foot [pes planus] (acquired), right foot: Secondary | ICD-10-CM | POA: Diagnosis not present

## 2016-11-04 DIAGNOSIS — M216X2 Other acquired deformities of left foot: Secondary | ICD-10-CM | POA: Diagnosis not present

## 2017-01-22 DIAGNOSIS — E119 Type 2 diabetes mellitus without complications: Secondary | ICD-10-CM | POA: Diagnosis not present

## 2017-01-22 DIAGNOSIS — E1159 Type 2 diabetes mellitus with other circulatory complications: Secondary | ICD-10-CM | POA: Diagnosis not present

## 2017-01-22 DIAGNOSIS — E782 Mixed hyperlipidemia: Secondary | ICD-10-CM | POA: Diagnosis not present

## 2017-01-30 DIAGNOSIS — E119 Type 2 diabetes mellitus without complications: Secondary | ICD-10-CM | POA: Diagnosis not present

## 2017-01-30 DIAGNOSIS — E1159 Type 2 diabetes mellitus with other circulatory complications: Secondary | ICD-10-CM | POA: Diagnosis not present

## 2017-01-30 DIAGNOSIS — I10 Essential (primary) hypertension: Secondary | ICD-10-CM | POA: Diagnosis not present

## 2017-01-30 DIAGNOSIS — E782 Mixed hyperlipidemia: Secondary | ICD-10-CM | POA: Diagnosis not present

## 2017-02-06 DIAGNOSIS — I779 Disorder of arteries and arterioles, unspecified: Secondary | ICD-10-CM | POA: Diagnosis not present

## 2017-02-06 DIAGNOSIS — I6523 Occlusion and stenosis of bilateral carotid arteries: Secondary | ICD-10-CM | POA: Diagnosis not present

## 2017-02-06 DIAGNOSIS — Z1231 Encounter for screening mammogram for malignant neoplasm of breast: Secondary | ICD-10-CM | POA: Diagnosis not present

## 2017-02-17 DIAGNOSIS — L408 Other psoriasis: Secondary | ICD-10-CM | POA: Diagnosis not present

## 2017-02-23 DIAGNOSIS — M5033 Other cervical disc degeneration, cervicothoracic region: Secondary | ICD-10-CM | POA: Diagnosis not present

## 2017-02-23 DIAGNOSIS — M5442 Lumbago with sciatica, left side: Secondary | ICD-10-CM | POA: Diagnosis not present

## 2017-02-23 DIAGNOSIS — M546 Pain in thoracic spine: Secondary | ICD-10-CM | POA: Diagnosis not present

## 2017-02-23 DIAGNOSIS — M5413 Radiculopathy, cervicothoracic region: Secondary | ICD-10-CM | POA: Diagnosis not present

## 2017-03-06 DIAGNOSIS — E041 Nontoxic single thyroid nodule: Secondary | ICD-10-CM | POA: Diagnosis not present

## 2017-03-07 DIAGNOSIS — E041 Nontoxic single thyroid nodule: Secondary | ICD-10-CM | POA: Diagnosis not present

## 2017-03-13 DIAGNOSIS — I6529 Occlusion and stenosis of unspecified carotid artery: Secondary | ICD-10-CM | POA: Diagnosis not present

## 2017-03-13 DIAGNOSIS — E041 Nontoxic single thyroid nodule: Secondary | ICD-10-CM | POA: Diagnosis not present

## 2017-03-13 DIAGNOSIS — Z6835 Body mass index (BMI) 35.0-35.9, adult: Secondary | ICD-10-CM | POA: Diagnosis not present

## 2017-03-23 DIAGNOSIS — E041 Nontoxic single thyroid nodule: Secondary | ICD-10-CM | POA: Diagnosis not present

## 2017-03-27 DIAGNOSIS — Z6835 Body mass index (BMI) 35.0-35.9, adult: Secondary | ICD-10-CM | POA: Diagnosis not present

## 2017-03-27 DIAGNOSIS — R200 Anesthesia of skin: Secondary | ICD-10-CM | POA: Diagnosis not present

## 2017-03-27 DIAGNOSIS — E041 Nontoxic single thyroid nodule: Secondary | ICD-10-CM | POA: Diagnosis not present

## 2017-04-03 DIAGNOSIS — K219 Gastro-esophageal reflux disease without esophagitis: Secondary | ICD-10-CM | POA: Diagnosis not present

## 2017-04-03 DIAGNOSIS — Z6835 Body mass index (BMI) 35.0-35.9, adult: Secondary | ICD-10-CM | POA: Diagnosis not present

## 2017-04-17 DIAGNOSIS — K219 Gastro-esophageal reflux disease without esophagitis: Secondary | ICD-10-CM | POA: Diagnosis not present

## 2017-04-17 DIAGNOSIS — M25569 Pain in unspecified knee: Secondary | ICD-10-CM | POA: Diagnosis not present

## 2017-04-17 DIAGNOSIS — Z6834 Body mass index (BMI) 34.0-34.9, adult: Secondary | ICD-10-CM | POA: Diagnosis not present

## 2017-04-21 DIAGNOSIS — M1711 Unilateral primary osteoarthritis, right knee: Secondary | ICD-10-CM | POA: Diagnosis not present

## 2017-04-21 DIAGNOSIS — M25561 Pain in right knee: Secondary | ICD-10-CM | POA: Diagnosis not present

## 2017-05-08 DIAGNOSIS — M25531 Pain in right wrist: Secondary | ICD-10-CM | POA: Diagnosis not present

## 2017-05-08 DIAGNOSIS — Z6834 Body mass index (BMI) 34.0-34.9, adult: Secondary | ICD-10-CM | POA: Diagnosis not present

## 2017-05-08 DIAGNOSIS — M25561 Pain in right knee: Secondary | ICD-10-CM | POA: Diagnosis not present

## 2017-05-08 DIAGNOSIS — B3789 Other sites of candidiasis: Secondary | ICD-10-CM | POA: Diagnosis not present

## 2017-05-14 DIAGNOSIS — L408 Other psoriasis: Secondary | ICD-10-CM | POA: Diagnosis not present

## 2017-05-14 DIAGNOSIS — Z79899 Other long term (current) drug therapy: Secondary | ICD-10-CM | POA: Diagnosis not present

## 2017-05-19 DIAGNOSIS — M654 Radial styloid tenosynovitis [de Quervain]: Secondary | ICD-10-CM | POA: Diagnosis not present

## 2017-05-19 DIAGNOSIS — M25531 Pain in right wrist: Secondary | ICD-10-CM | POA: Diagnosis not present

## 2017-05-19 DIAGNOSIS — M25441 Effusion, right hand: Secondary | ICD-10-CM | POA: Diagnosis not present

## 2017-05-19 DIAGNOSIS — M6281 Muscle weakness (generalized): Secondary | ICD-10-CM | POA: Diagnosis not present

## 2017-05-22 DIAGNOSIS — M654 Radial styloid tenosynovitis [de Quervain]: Secondary | ICD-10-CM | POA: Diagnosis not present

## 2017-05-22 DIAGNOSIS — M25531 Pain in right wrist: Secondary | ICD-10-CM | POA: Diagnosis not present

## 2017-05-22 DIAGNOSIS — M6281 Muscle weakness (generalized): Secondary | ICD-10-CM | POA: Diagnosis not present

## 2017-05-22 DIAGNOSIS — M25441 Effusion, right hand: Secondary | ICD-10-CM | POA: Diagnosis not present

## 2017-05-25 DIAGNOSIS — M25531 Pain in right wrist: Secondary | ICD-10-CM | POA: Diagnosis not present

## 2017-05-25 DIAGNOSIS — M6281 Muscle weakness (generalized): Secondary | ICD-10-CM | POA: Diagnosis not present

## 2017-05-25 DIAGNOSIS — E1159 Type 2 diabetes mellitus with other circulatory complications: Secondary | ICD-10-CM | POA: Diagnosis not present

## 2017-05-25 DIAGNOSIS — M654 Radial styloid tenosynovitis [de Quervain]: Secondary | ICD-10-CM | POA: Diagnosis not present

## 2017-05-25 DIAGNOSIS — E782 Mixed hyperlipidemia: Secondary | ICD-10-CM | POA: Diagnosis not present

## 2017-05-25 DIAGNOSIS — M25441 Effusion, right hand: Secondary | ICD-10-CM | POA: Diagnosis not present

## 2017-06-02 DIAGNOSIS — M25531 Pain in right wrist: Secondary | ICD-10-CM | POA: Diagnosis not present

## 2017-06-02 DIAGNOSIS — M25561 Pain in right knee: Secondary | ICD-10-CM | POA: Diagnosis not present

## 2017-06-03 DIAGNOSIS — M25441 Effusion, right hand: Secondary | ICD-10-CM | POA: Diagnosis not present

## 2017-06-03 DIAGNOSIS — M25531 Pain in right wrist: Secondary | ICD-10-CM | POA: Diagnosis not present

## 2017-06-03 DIAGNOSIS — M654 Radial styloid tenosynovitis [de Quervain]: Secondary | ICD-10-CM | POA: Diagnosis not present

## 2017-06-03 DIAGNOSIS — M6281 Muscle weakness (generalized): Secondary | ICD-10-CM | POA: Diagnosis not present

## 2017-06-05 DIAGNOSIS — M25441 Effusion, right hand: Secondary | ICD-10-CM | POA: Diagnosis not present

## 2017-06-05 DIAGNOSIS — M25531 Pain in right wrist: Secondary | ICD-10-CM | POA: Diagnosis not present

## 2017-06-05 DIAGNOSIS — M6281 Muscle weakness (generalized): Secondary | ICD-10-CM | POA: Diagnosis not present

## 2017-06-05 DIAGNOSIS — M654 Radial styloid tenosynovitis [de Quervain]: Secondary | ICD-10-CM | POA: Diagnosis not present

## 2017-07-08 DIAGNOSIS — L408 Other psoriasis: Secondary | ICD-10-CM | POA: Diagnosis not present

## 2017-10-02 DIAGNOSIS — E1159 Type 2 diabetes mellitus with other circulatory complications: Secondary | ICD-10-CM | POA: Diagnosis not present

## 2017-10-02 DIAGNOSIS — E782 Mixed hyperlipidemia: Secondary | ICD-10-CM | POA: Diagnosis not present

## 2017-10-07 DIAGNOSIS — I10 Essential (primary) hypertension: Secondary | ICD-10-CM | POA: Diagnosis not present

## 2017-10-07 DIAGNOSIS — Z23 Encounter for immunization: Secondary | ICD-10-CM | POA: Diagnosis not present

## 2017-10-07 DIAGNOSIS — E1129 Type 2 diabetes mellitus with other diabetic kidney complication: Secondary | ICD-10-CM | POA: Diagnosis not present

## 2017-10-07 DIAGNOSIS — E1159 Type 2 diabetes mellitus with other circulatory complications: Secondary | ICD-10-CM | POA: Diagnosis not present

## 2017-10-07 DIAGNOSIS — E782 Mixed hyperlipidemia: Secondary | ICD-10-CM | POA: Diagnosis not present

## 2017-10-14 DIAGNOSIS — L408 Other psoriasis: Secondary | ICD-10-CM | POA: Diagnosis not present

## 2017-12-06 ENCOUNTER — Encounter (HOSPITAL_BASED_OUTPATIENT_CLINIC_OR_DEPARTMENT_OTHER): Payer: Self-pay | Admitting: *Deleted

## 2017-12-06 ENCOUNTER — Other Ambulatory Visit: Payer: Self-pay

## 2017-12-06 ENCOUNTER — Emergency Department (HOSPITAL_BASED_OUTPATIENT_CLINIC_OR_DEPARTMENT_OTHER)
Admission: EM | Admit: 2017-12-06 | Discharge: 2017-12-06 | Disposition: A | Discharge status: Home / Self Care | Payer: BLUE CROSS/BLUE SHIELD | Attending: Emergency Medicine | Admitting: Emergency Medicine

## 2017-12-06 DIAGNOSIS — R42 Dizziness and giddiness: Secondary | ICD-10-CM | POA: Diagnosis not present

## 2017-12-06 DIAGNOSIS — E860 Dehydration: Secondary | ICD-10-CM | POA: Insufficient documentation

## 2017-12-06 DIAGNOSIS — Z79899 Other long term (current) drug therapy: Secondary | ICD-10-CM | POA: Insufficient documentation

## 2017-12-06 DIAGNOSIS — Z7984 Long term (current) use of oral hypoglycemic drugs: Secondary | ICD-10-CM | POA: Diagnosis not present

## 2017-12-06 DIAGNOSIS — E119 Type 2 diabetes mellitus without complications: Secondary | ICD-10-CM | POA: Diagnosis not present

## 2017-12-06 DIAGNOSIS — I10 Essential (primary) hypertension: Secondary | ICD-10-CM | POA: Insufficient documentation

## 2017-12-06 DIAGNOSIS — Z7982 Long term (current) use of aspirin: Secondary | ICD-10-CM | POA: Diagnosis not present

## 2017-12-06 LAB — CBC WITH DIFFERENTIAL/PLATELET
Abs Immature Granulocytes: 0.02 10*3/uL (ref 0.00–0.07)
BASOS ABS: 0.1 10*3/uL (ref 0.0–0.1)
Basophils Relative: 0 %
EOS ABS: 0.1 10*3/uL (ref 0.0–0.5)
Eosinophils Relative: 1 %
HEMATOCRIT: 40.8 % (ref 36.0–46.0)
Hemoglobin: 13.3 g/dL (ref 12.0–15.0)
IMMATURE GRANULOCYTES: 0 %
LYMPHS ABS: 2.7 10*3/uL (ref 0.7–4.0)
Lymphocytes Relative: 23 %
MCH: 28.1 pg (ref 26.0–34.0)
MCHC: 32.6 g/dL (ref 30.0–36.0)
MCV: 86.1 fL (ref 80.0–100.0)
Monocytes Absolute: 0.7 10*3/uL (ref 0.1–1.0)
Monocytes Relative: 6 %
NEUTROS PCT: 70 %
NRBC: 0 % (ref 0.0–0.2)
Neutro Abs: 8.3 10*3/uL — ABNORMAL HIGH (ref 1.7–7.7)
Platelets: 370 10*3/uL (ref 150–400)
RBC: 4.74 MIL/uL (ref 3.87–5.11)
RDW: 13.4 % (ref 11.5–15.5)
WBC: 11.8 10*3/uL — ABNORMAL HIGH (ref 4.0–10.5)

## 2017-12-06 LAB — URINALYSIS, ROUTINE W REFLEX MICROSCOPIC
Bilirubin Urine: NEGATIVE
Glucose, UA: NEGATIVE mg/dL
Hgb urine dipstick: NEGATIVE
KETONES UR: NEGATIVE mg/dL
Leukocytes, UA: NEGATIVE
Nitrite: NEGATIVE
PROTEIN: NEGATIVE mg/dL
Specific Gravity, Urine: 1.015 (ref 1.005–1.030)
pH: 8 (ref 5.0–8.0)

## 2017-12-06 LAB — COMPREHENSIVE METABOLIC PANEL
ALBUMIN: 3.7 g/dL (ref 3.5–5.0)
ALT: 22 U/L (ref 0–44)
AST: 22 U/L (ref 15–41)
Alkaline Phosphatase: 103 U/L (ref 38–126)
Anion gap: 10 (ref 5–15)
BUN: 18 mg/dL (ref 6–20)
CHLORIDE: 100 mmol/L (ref 98–111)
CO2: 25 mmol/L (ref 22–32)
Calcium: 9 mg/dL (ref 8.9–10.3)
Creatinine, Ser: 0.79 mg/dL (ref 0.44–1.00)
GFR calc Af Amer: >60 mL/min (ref >60)
GFR calc non Af Amer: >60 mL/min (ref >60)
Glucose, Bld: 108 mg/dL — ABNORMAL HIGH (ref 70–99)
POTASSIUM: 3.6 mmol/L (ref 3.5–5.1)
Sodium: 135 mmol/L (ref 135–145)
Total Bilirubin: 0.4 mg/dL (ref 0.3–1.2)
Total Protein: 7.3 g/dL (ref 6.5–8.1)

## 2017-12-06 LAB — CK: Total CK: 62 U/L (ref 38–234)

## 2017-12-06 MED ORDER — SODIUM CHLORIDE 0.9 % IV BOLUS
1000.0000 mL | Freq: Once | INTRAVENOUS | Status: AC
  Administered 2017-12-06: 1000 mL via INTRAVENOUS

## 2017-12-06 NOTE — ED Provider Notes (Signed)
[facility] EMERGENCY DEPARTMENT Provider Note   CSN: [id number] Arrival date & time: 12/06/17  1735     History   Chief Complaint Chief Complaint  Patient presents with  . Hypertension    HPI Deborah Salas is a 52 y.o. female.  HPI Patient had her blood pressure medication increased on Tuesday.  States she began having episodes of dizziness which started on Thursday.  Associated with nausea.  Patient also endorsed generalized fatigue and urinary frequency.  She is had some mild diffuse myalgias.  Denies any fever or chills.  No chest pain or shortness of breath.  No focal weakness or numbness. Past Medical History:  Diagnosis Date  . Diabetes mellitus without complication (HCC)   . Hypercholesterolemia   . Hypertension   . Psoriasis     Patient Active Problem List   Diagnosis Date Noted  . DIABETES MELLITUS, TYPE II 12/11/2008  . HYPERLIPIDEMIA, MILD 12/11/2008  . OBESITY, UNSPECIFIED 12/11/2008  . HYPERTENSION 12/11/2008  . EXTERNAL HEMORRHOIDS WITH OTHER COMPLICATION 12/11/2008  . BURSITIS, RIGHT ELBOW 12/11/2008  . DYSURIA 12/11/2008    Past Surgical History:  Procedure Laterality Date  . ABDOMINAL HYSTERECTOMY       OB History   None      Home Medications    Prior to Admission medications   Medication Sig Start Date End Date Taking? Authorizing Provider  aspirin 81 MG tablet Take 81 mg by mouth daily.   Yes [provider]  Liraglutide (VICTOZA [place]) Inject into the skin.   Yes [provider]  METFORMIN HCL PO Take 500 mg by mouth 3 (three) times daily.    Yes [provider]  olmesartan-hydrochlorothiazide (BENICAR HCT) 40-12.5 MG tablet Take 1 tablet by mouth daily.   Yes [provider]  SIMVASTATIN PO Take 10 mg by mouth daily.    Yes [provider]  cephALEXin (KEFLEX) 500 MG capsule Take 1 capsule (500 mg total) by mouth 3 (three) times daily. 08/14/15   Lyndal Pulley, MD    Family  History No family history on file.  Social History Social History   Tobacco Use  . Smoking status: Never Smoker  . Smokeless tobacco: Never Used  Substance Use Topics  . Alcohol use: No  . Drug use: No     Allergies   Patient has no known allergies.   Review of Systems Review of Systems  Constitutional: Positive for fatigue. Negative for chills and fever.  HENT: Negative for trouble swallowing.   Eyes: Negative for visual disturbance.  Respiratory: Negative for cough and shortness of breath.   Cardiovascular: Negative for chest pain.  Gastrointestinal: Positive for nausea. Negative for abdominal pain, constipation, diarrhea and vomiting.  Genitourinary: Positive for frequency. Negative for difficulty urinating, dysuria and flank pain.  Musculoskeletal: Positive for myalgias. Negative for back pain, neck pain and neck stiffness.  Skin: Negative for rash and wound.  Neurological: Positive for dizziness, weakness and light-headedness. Negative for syncope, numbness and headaches.  All other systems reviewed and are negative.    Physical Exam Updated Vital Signs BP 113/78   Pulse 89   Temp 98 F (36.7 C) (Oral)   Resp 19   SpO2 96%   Physical Exam  Constitutional: She is oriented to person, place, and time. She appears well-developed and well-nourished. No distress.  HENT:  Head: Normocephalic and atraumatic.  Mouth/Throat: Oropharynx is clear and moist. No oropharyngeal exudate.  Eyes: Pupils are equal, round, and reactive to light.  EOM are normal.  Few beats of horizontal nystagmus  Neck: Normal range of motion. Neck supple. No JVD present.  Cardiovascular: Normal rate and regular rhythm. Exam reveals no gallop and no friction rub.  No murmur heard. Pulmonary/Chest: Effort normal and breath sounds normal. No respiratory distress. She has no wheezes. She has no rales.  Abdominal: Soft. Bowel sounds are normal.  Musculoskeletal: Normal range of motion. She exhibits  no edema or tenderness.  Midline thoracic or lumbar tenderness.  No CVA tenderness.  No lower extremity swelling, asymmetry or tenderness.  Lymphadenopathy:    She has no cervical adenopathy.  Neurological: She is alert and oriented to person, place, and time.  Skin: Skin is warm and dry. Rash noted. No erythema.  Scaly plaques to the bilateral lower legs.  No erythema or warmth.  Psychiatric: She has a normal mood and affect. Her behavior is normal.  Nursing note and vitals reviewed.    ED Treatments / Results  Labs (all labs ordered are listed, but only abnormal results are displayed) Labs Reviewed  CBC WITH DIFFERENTIAL/PLATELET - Abnormal; Notable for the following components:      Result Value   WBC 11.8 (*)    Neutro Abs 8.3 (*)    All other components within normal limits  COMPREHENSIVE METABOLIC PANEL - Abnormal; Notable for the following components:   Glucose, Bld 108 (*)    All other components within normal limits  URINALYSIS, ROUTINE W REFLEX MICROSCOPIC  CK    EKG None  Radiology No results found.  Procedures Procedures (including critical care time)  Medications Ordered in ED Medications  sodium chloride 0.9 % bolus 1,000 mL (0 mLs Intravenous Stopped 12/06/17 1947)     Initial Impression / Assessment and Plan / ED Course  I have reviewed the triage vital signs and the nursing notes.  Pertinent labs & imaging results that were available during my care of the patient were reviewed by me and considered in my medical decision making (see chart for details).     Patient is well-appearing.  Symptoms of resolved with IV fluids.  Question vertiginous symptoms versus orthostasis.  Advised to hold her blood pressure medication until she can be reevaluated by her primary doctor tomorrow.  Encouraged to change positions slowly.  Strict return precautions have been given.  Final Clinical Impressions(s) / ED Diagnoses   Final diagnoses:  Dehydration  Dizziness     ED Discharge Orders    None       Loren Racer, MD 12/06/17 2324

## 2017-12-06 NOTE — ED Triage Notes (Signed)
Pt reports her bp was elevated today and "I don't have any energy". States her meds have recently been changed. BP 106/64 in triage. Denies pain

## 2017-12-06 NOTE — ED Notes (Signed)
Pt was able to ambulate without assistance to restroom. NAD noted

## 2017-12-06 NOTE — Discharge Instructions (Addendum)
Hold your blood pressure medication for this evening.  Follow-up with your primary physician tomorrow to have your blood pressure rechecked and medication adjusted as needed.  Change positions slowly.  Drink plenty fluids.

## 2017-12-07 DIAGNOSIS — E1159 Type 2 diabetes mellitus with other circulatory complications: Secondary | ICD-10-CM | POA: Diagnosis not present

## 2017-12-07 DIAGNOSIS — Z7189 Other specified counseling: Secondary | ICD-10-CM | POA: Diagnosis not present

## 2017-12-07 DIAGNOSIS — I10 Essential (primary) hypertension: Secondary | ICD-10-CM | POA: Diagnosis not present

## 2017-12-09 DIAGNOSIS — M5442 Lumbago with sciatica, left side: Secondary | ICD-10-CM | POA: Diagnosis not present

## 2017-12-09 DIAGNOSIS — M546 Pain in thoracic spine: Secondary | ICD-10-CM | POA: Diagnosis not present

## 2017-12-09 DIAGNOSIS — M5413 Radiculopathy, cervicothoracic region: Secondary | ICD-10-CM | POA: Diagnosis not present

## 2017-12-09 DIAGNOSIS — M5033 Other cervical disc degeneration, cervicothoracic region: Secondary | ICD-10-CM | POA: Diagnosis not present

## 2017-12-16 DIAGNOSIS — E1129 Type 2 diabetes mellitus with other diabetic kidney complication: Secondary | ICD-10-CM | POA: Diagnosis not present

## 2017-12-16 DIAGNOSIS — Z6835 Body mass index (BMI) 35.0-35.9, adult: Secondary | ICD-10-CM | POA: Diagnosis not present

## 2018-02-01 DIAGNOSIS — E119 Type 2 diabetes mellitus without complications: Secondary | ICD-10-CM | POA: Diagnosis not present

## 2018-02-01 DIAGNOSIS — E782 Mixed hyperlipidemia: Secondary | ICD-10-CM | POA: Diagnosis not present

## 2018-02-01 DIAGNOSIS — Z6835 Body mass index (BMI) 35.0-35.9, adult: Secondary | ICD-10-CM | POA: Diagnosis not present

## 2018-02-01 DIAGNOSIS — E1159 Type 2 diabetes mellitus with other circulatory complications: Secondary | ICD-10-CM | POA: Diagnosis not present

## 2018-02-01 DIAGNOSIS — I10 Essential (primary) hypertension: Secondary | ICD-10-CM | POA: Diagnosis not present

## 2018-02-08 DIAGNOSIS — Z7189 Other specified counseling: Secondary | ICD-10-CM | POA: Diagnosis not present

## 2018-02-08 DIAGNOSIS — E782 Mixed hyperlipidemia: Secondary | ICD-10-CM | POA: Diagnosis not present

## 2018-02-08 DIAGNOSIS — I10 Essential (primary) hypertension: Secondary | ICD-10-CM | POA: Diagnosis not present

## 2018-02-08 DIAGNOSIS — E119 Type 2 diabetes mellitus without complications: Secondary | ICD-10-CM | POA: Diagnosis not present

## 2018-02-08 DIAGNOSIS — E1159 Type 2 diabetes mellitus with other circulatory complications: Secondary | ICD-10-CM | POA: Diagnosis not present

## 2018-02-17 ENCOUNTER — Other Ambulatory Visit: Payer: Self-pay

## 2018-02-17 ENCOUNTER — Encounter (HOSPITAL_BASED_OUTPATIENT_CLINIC_OR_DEPARTMENT_OTHER): Payer: Self-pay | Admitting: *Deleted

## 2018-02-17 ENCOUNTER — Emergency Department (HOSPITAL_BASED_OUTPATIENT_CLINIC_OR_DEPARTMENT_OTHER)
Admission: EM | Admit: 2018-02-17 | Discharge: 2018-02-17 | Disposition: A | Discharge status: Home / Self Care | Payer: BLUE CROSS/BLUE SHIELD | Attending: Emergency Medicine | Admitting: Emergency Medicine

## 2018-02-17 DIAGNOSIS — Z7984 Long term (current) use of oral hypoglycemic drugs: Secondary | ICD-10-CM | POA: Diagnosis not present

## 2018-02-17 DIAGNOSIS — E119 Type 2 diabetes mellitus without complications: Secondary | ICD-10-CM | POA: Diagnosis not present

## 2018-02-17 DIAGNOSIS — Z7982 Long term (current) use of aspirin: Secondary | ICD-10-CM | POA: Insufficient documentation

## 2018-02-17 DIAGNOSIS — R519 Headache, unspecified: Secondary | ICD-10-CM

## 2018-02-17 DIAGNOSIS — Z79899 Other long term (current) drug therapy: Secondary | ICD-10-CM | POA: Diagnosis not present

## 2018-02-17 DIAGNOSIS — R51 Headache: Secondary | ICD-10-CM | POA: Diagnosis not present

## 2018-02-17 DIAGNOSIS — I10 Essential (primary) hypertension: Secondary | ICD-10-CM | POA: Diagnosis not present

## 2018-02-17 MED ORDER — SODIUM CHLORIDE 0.9 % IV BOLUS
1000.0000 mL | Freq: Once | INTRAVENOUS | Status: AC
  Administered 2018-02-17: 1000 mL via INTRAVENOUS

## 2018-02-17 MED ORDER — DIPHENHYDRAMINE HCL 50 MG/ML IJ SOLN
12.5000 mg | Freq: Once | INTRAMUSCULAR | Status: AC
  Administered 2018-02-17: 12.5 mg via INTRAVENOUS
  Filled 2018-02-17: qty 1

## 2018-02-17 MED ORDER — PROCHLORPERAZINE EDISYLATE 10 MG/2ML IJ SOLN
10.0000 mg | Freq: Once | INTRAMUSCULAR | Status: AC
  Administered 2018-02-17: 10 mg via INTRAVENOUS
  Filled 2018-02-17: qty 2

## 2018-02-17 MED ORDER — SODIUM CHLORIDE 0.9 % IV SOLN: INTRAVENOUS | Status: DC

## 2018-02-17 MED ORDER — KETOROLAC TROMETHAMINE 15 MG/ML IJ SOLN
15.0000 mg | Freq: Once | INTRAMUSCULAR | Status: AC
  Administered 2018-02-17: 15 mg via INTRAVENOUS
  Filled 2018-02-17: qty 1

## 2018-02-17 NOTE — ED Notes (Signed)
C/o ha onset last pm  Denies n/v

## 2018-02-17 NOTE — Discharge Instructions (Addendum)
You were seen in the ER today with a headache.  This improved with a migraine cocktail.  Should the headache return please take Tylenol and/or Motrin per over-the-counter dosing.  Please follow-up with primary care in 3 days, return to the ER for new or worsening symptoms or any other concerns.

## 2018-02-17 NOTE — ED Triage Notes (Signed)
Frontal HA since last night. Took Tylenol at 8am and "it helped a little bit."

## 2018-02-17 NOTE — ED Provider Notes (Signed)
[facility] EMERGENCY DEPARTMENT Provider Note   CSN: [id number] Arrival date & time: 02/17/18  0947     History   Chief Complaint Chief Complaint  Patient presents with  . Headache    HPI Deborah Salas is a 53 y.o. female with a hx of HTN, hypercholesterolemia, T2DM, and obesity who presents to the ED with complaints of a headache that started last night. Patient states headache began gradually and progressively worsened. States pain is a throbbing sensation in the frontal region. Current pain is a 7/10 in severity, she applied a cool compress & tried tylenol without much relief. No specific aggravating factors. She has had some nausea without vomiting and intermittent mild bilateral blurry vision. She states she has a hx of headache similar to this several years ago- she was told this was a migraine and was given IV medications with improvement. Denies fever, chills, dizziness, numbness, tingling, weakness, or neck stiffness. She has had some nasal congestion as well.   HPI  Past Medical History:  Diagnosis Date  . Diabetes mellitus without complication (HCC)   . Hypercholesterolemia   . Hypertension   . Psoriasis     Patient Active Problem List   Diagnosis Date Noted  . DIABETES MELLITUS, TYPE II 12/11/2008  . HYPERLIPIDEMIA, MILD 12/11/2008  . OBESITY, UNSPECIFIED 12/11/2008  . HYPERTENSION 12/11/2008  . EXTERNAL HEMORRHOIDS WITH OTHER COMPLICATION 12/11/2008  . BURSITIS, RIGHT ELBOW 12/11/2008  . DYSURIA 12/11/2008    Past Surgical History:  Procedure Laterality Date  . ABDOMINAL HYSTERECTOMY       OB History   No obstetric history on file.      Home Medications    Prior to Admission medications   Medication Sig Start Date End Date Taking? Authorizing Provider  aspirin 81 MG tablet Take 81 mg by mouth daily.    [provider]  cephALEXin (KEFLEX) 500 MG capsule Take 1 capsule (500 mg total) by mouth 3 (three) times daily. 08/14/15    Lyndal Pulley, MD  Liraglutide (VICTOZA [place]) Inject into the skin.    [provider]  METFORMIN HCL PO Take 500 mg by mouth 3 (three) times daily.     [provider]  olmesartan-hydrochlorothiazide (BENICAR HCT) 40-12.5 MG tablet Take 1 tablet by mouth daily.    [provider]  SIMVASTATIN PO Take 10 mg by mouth daily.     [provider]    Family History No family history on file.  Social History Social History   Tobacco Use  . Smoking status: Never Smoker  . Smokeless tobacco: Never Used  Substance Use Topics  . Alcohol use: No  . Drug use: No     Allergies   Patient has no known allergies.   Review of Systems Review of Systems  Constitutional: Negative for chills and fever.  HENT: Positive for congestion. Negative for sore throat.   Eyes: Positive for visual disturbance (intermittent mild blurry vision bilaterally).  Respiratory: Negative for shortness of breath.   Cardiovascular: Negative for chest pain.  Gastrointestinal: Positive for nausea. Negative for abdominal pain and vomiting.  Musculoskeletal: Negative for neck stiffness.  Neurological: Positive for headaches. Negative for dizziness, seizures, syncope, facial asymmetry, speech difficulty and weakness.  All other systems reviewed and are negative.    Physical Exam Updated Vital Signs BP 133/74 (BP Location: Right Arm)   Pulse 87   Temp 98.7 F (37.1 C) (Oral)   Resp 18   SpO2 97%   Physical  Exam Vitals signs and nursing note reviewed.  Constitutional:      General: She is not in acute distress.    Appearance: She is well-developed. She is not toxic-appearing.  HENT:     Head: Normocephalic and atraumatic.  Eyes:     General: Vision grossly intact. Gaze aligned appropriately.        Right eye: No discharge.        Left eye: No discharge.     Extraocular Movements: Extraocular movements intact.     Conjunctiva/sclera: Conjunctivae normal.     Comments:  PERRL. No proptosis.   Neck:     Musculoskeletal: Normal range of motion and neck supple. No neck rigidity.  Cardiovascular:     Rate and Rhythm: Normal rate and regular rhythm.  Pulmonary:     Effort: Pulmonary effort is normal. No respiratory distress.     Breath sounds: Normal breath sounds. No wheezing, rhonchi or rales.  Abdominal:     General: There is no distension.     Palpations: Abdomen is soft.     Tenderness: There is no abdominal tenderness.  Skin:    General: Skin is warm and dry.     Findings: No rash.  Neurological:     Mental Status: She is alert.     Comments: Clear speech. No facial droop. CNIII-XII grossly intact. Bilateral upper and lower extremities' sensation grossly intact. 5/5 symmetric strength with grip strength and with plantar and dorsi flexion bilaterally.Normal finger to nose & heel to shin bilaterally. Negative pronator drift. Negative Romberg sign. Gait is steady and intact.    Psychiatric:        Behavior: Behavior normal.      ED Treatments / Results  Labs (all labs ordered are listed, but only abnormal results are displayed) Labs Reviewed - No data to display  EKG None  Radiology No results found.  Procedures Procedures (including critical care time)  Medications Ordered in ED Medications  sodium chloride 0.9 % bolus 1,000 mL (1,000 mLs Intravenous New Bag/Given 02/17/18 1035)    And  0.9 %  sodium chloride infusion (has no administration in time range)  ketorolac (TORADOL) 15 MG/ML injection 15 mg (15 mg Intravenous Given 02/17/18 1039)  prochlorperazine (COMPAZINE) injection 10 mg (10 mg Intravenous Given 02/17/18 1039)  diphenhydrAMINE (BENADRYL) injection 12.5 mg (12.5 mg Intravenous Given 02/17/18 1037)     Initial Impression / Assessment and Plan / ED Course  I have reviewed the triage vital signs and the nursing notes.  Pertinent labs & imaging results that were available during my care of the patient were reviewed by me and  considered in my medical decision making (see chart for details).    Patient presents with complaint of headache. Patient is nontoxic appearing, vitals WNL. Patient has hx of similar headaches, gradual onset with steady progression in severity,  She is afebrile with no focal neuro deficits, dizziness, proptosis, or nuchal rigidity- non concerning [facility], ICH, ischemic CVA, dural venous sinus thrombosis, acute glaucoma, giant cell arteritis, mass, or meningitis. Patient treated for headache with migraine cocktail with improvement. I discussed treatment plan, need for PCP follow-up, and return precautions with the patient. Provided opportunity for questions, patient confirmed understanding and is in agreement with plan.   Final Clinical Impressions(s) / ED Diagnoses   Final diagnoses:  Acute nonintractable headache, unspecified headache type    ED Discharge Orders    None       Cherly Andersonetrucelli, Samantha R, PA-C 02/17/18  1224    Alvira Monday, MD 02/19/18 1013

## 2018-02-22 DIAGNOSIS — Z1231 Encounter for screening mammogram for malignant neoplasm of breast: Secondary | ICD-10-CM | POA: Diagnosis not present

## 2018-03-08 DIAGNOSIS — Z79899 Other long term (current) drug therapy: Secondary | ICD-10-CM | POA: Diagnosis not present

## 2018-03-08 DIAGNOSIS — L408 Other psoriasis: Secondary | ICD-10-CM | POA: Diagnosis not present

## 2018-03-23 DIAGNOSIS — Z79899 Other long term (current) drug therapy: Secondary | ICD-10-CM | POA: Diagnosis not present

## 2018-03-29 DIAGNOSIS — M5442 Lumbago with sciatica, left side: Secondary | ICD-10-CM | POA: Diagnosis not present

## 2018-03-29 DIAGNOSIS — M5413 Radiculopathy, cervicothoracic region: Secondary | ICD-10-CM | POA: Diagnosis not present

## 2018-03-29 DIAGNOSIS — M5033 Other cervical disc degeneration, cervicothoracic region: Secondary | ICD-10-CM | POA: Diagnosis not present

## 2018-03-29 DIAGNOSIS — M546 Pain in thoracic spine: Secondary | ICD-10-CM | POA: Diagnosis not present

## 2018-04-05 DIAGNOSIS — M546 Pain in thoracic spine: Secondary | ICD-10-CM | POA: Diagnosis not present

## 2018-04-05 DIAGNOSIS — M5442 Lumbago with sciatica, left side: Secondary | ICD-10-CM | POA: Diagnosis not present

## 2018-04-05 DIAGNOSIS — M5033 Other cervical disc degeneration, cervicothoracic region: Secondary | ICD-10-CM | POA: Diagnosis not present

## 2018-04-05 DIAGNOSIS — M5413 Radiculopathy, cervicothoracic region: Secondary | ICD-10-CM | POA: Diagnosis not present

## 2018-04-06 DIAGNOSIS — Z79899 Other long term (current) drug therapy: Secondary | ICD-10-CM | POA: Diagnosis not present

## 2018-04-19 DIAGNOSIS — E119 Type 2 diabetes mellitus without complications: Secondary | ICD-10-CM | POA: Diagnosis not present

## 2018-04-22 DIAGNOSIS — M5413 Radiculopathy, cervicothoracic region: Secondary | ICD-10-CM | POA: Diagnosis not present

## 2018-04-22 DIAGNOSIS — M5033 Other cervical disc degeneration, cervicothoracic region: Secondary | ICD-10-CM | POA: Diagnosis not present

## 2018-04-22 DIAGNOSIS — M546 Pain in thoracic spine: Secondary | ICD-10-CM | POA: Diagnosis not present

## 2018-05-13 DIAGNOSIS — L989 Disorder of the skin and subcutaneous tissue, unspecified: Secondary | ICD-10-CM | POA: Diagnosis not present

## 2018-05-13 DIAGNOSIS — L409 Psoriasis, unspecified: Secondary | ICD-10-CM | POA: Diagnosis not present

## 2018-05-13 DIAGNOSIS — Z7189 Other specified counseling: Secondary | ICD-10-CM | POA: Diagnosis not present

## 2018-06-02 DIAGNOSIS — M1990 Unspecified osteoarthritis, unspecified site: Secondary | ICD-10-CM | POA: Diagnosis not present

## 2018-06-02 DIAGNOSIS — Z7189 Other specified counseling: Secondary | ICD-10-CM | POA: Diagnosis not present

## 2018-06-02 DIAGNOSIS — E782 Mixed hyperlipidemia: Secondary | ICD-10-CM | POA: Diagnosis not present

## 2018-06-02 DIAGNOSIS — E1159 Type 2 diabetes mellitus with other circulatory complications: Secondary | ICD-10-CM | POA: Diagnosis not present

## 2018-06-02 DIAGNOSIS — T23039A Burn of unspecified degree of unspecified multiple fingers (nail), not including thumb, initial encounter: Secondary | ICD-10-CM | POA: Diagnosis not present

## 2018-06-09 DIAGNOSIS — E1129 Type 2 diabetes mellitus with other diabetic kidney complication: Secondary | ICD-10-CM | POA: Diagnosis not present

## 2018-06-09 DIAGNOSIS — E782 Mixed hyperlipidemia: Secondary | ICD-10-CM | POA: Diagnosis not present

## 2018-06-09 DIAGNOSIS — I10 Essential (primary) hypertension: Secondary | ICD-10-CM | POA: Diagnosis not present

## 2018-07-07 DIAGNOSIS — Z79899 Other long term (current) drug therapy: Secondary | ICD-10-CM | POA: Diagnosis not present

## 2018-07-07 DIAGNOSIS — L408 Other psoriasis: Secondary | ICD-10-CM | POA: Diagnosis not present

## 2018-07-09 DIAGNOSIS — I951 Orthostatic hypotension: Secondary | ICD-10-CM | POA: Diagnosis not present

## 2018-07-21 DIAGNOSIS — M5413 Radiculopathy, cervicothoracic region: Secondary | ICD-10-CM | POA: Diagnosis not present

## 2018-07-21 DIAGNOSIS — M546 Pain in thoracic spine: Secondary | ICD-10-CM | POA: Diagnosis not present

## 2018-07-21 DIAGNOSIS — M5442 Lumbago with sciatica, left side: Secondary | ICD-10-CM | POA: Diagnosis not present

## 2018-08-18 DIAGNOSIS — M5413 Radiculopathy, cervicothoracic region: Secondary | ICD-10-CM | POA: Diagnosis not present

## 2018-08-18 DIAGNOSIS — M546 Pain in thoracic spine: Secondary | ICD-10-CM | POA: Diagnosis not present

## 2018-08-26 DIAGNOSIS — M5413 Radiculopathy, cervicothoracic region: Secondary | ICD-10-CM | POA: Diagnosis not present

## 2018-08-26 DIAGNOSIS — M546 Pain in thoracic spine: Secondary | ICD-10-CM | POA: Diagnosis not present

## 2018-09-01 DIAGNOSIS — M546 Pain in thoracic spine: Secondary | ICD-10-CM | POA: Diagnosis not present

## 2018-09-01 DIAGNOSIS — M5033 Other cervical disc degeneration, cervicothoracic region: Secondary | ICD-10-CM | POA: Diagnosis not present

## 2018-09-01 DIAGNOSIS — M5413 Radiculopathy, cervicothoracic region: Secondary | ICD-10-CM | POA: Diagnosis not present

## 2018-09-02 DIAGNOSIS — M5413 Radiculopathy, cervicothoracic region: Secondary | ICD-10-CM | POA: Diagnosis not present

## 2018-09-02 DIAGNOSIS — M546 Pain in thoracic spine: Secondary | ICD-10-CM | POA: Diagnosis not present

## 2018-09-03 DIAGNOSIS — M546 Pain in thoracic spine: Secondary | ICD-10-CM | POA: Diagnosis not present

## 2018-09-03 DIAGNOSIS — M5413 Radiculopathy, cervicothoracic region: Secondary | ICD-10-CM | POA: Diagnosis not present

## 2018-09-06 DIAGNOSIS — M546 Pain in thoracic spine: Secondary | ICD-10-CM | POA: Diagnosis not present

## 2018-09-06 DIAGNOSIS — M5413 Radiculopathy, cervicothoracic region: Secondary | ICD-10-CM | POA: Diagnosis not present

## 2018-09-08 DIAGNOSIS — M5413 Radiculopathy, cervicothoracic region: Secondary | ICD-10-CM | POA: Diagnosis not present

## 2018-09-08 DIAGNOSIS — M546 Pain in thoracic spine: Secondary | ICD-10-CM | POA: Diagnosis not present

## 2018-09-09 DIAGNOSIS — M546 Pain in thoracic spine: Secondary | ICD-10-CM | POA: Diagnosis not present

## 2018-09-09 DIAGNOSIS — M5413 Radiculopathy, cervicothoracic region: Secondary | ICD-10-CM | POA: Diagnosis not present

## 2018-09-10 DIAGNOSIS — M546 Pain in thoracic spine: Secondary | ICD-10-CM | POA: Diagnosis not present

## 2018-09-10 DIAGNOSIS — M5413 Radiculopathy, cervicothoracic region: Secondary | ICD-10-CM | POA: Diagnosis not present

## 2018-09-13 DIAGNOSIS — M546 Pain in thoracic spine: Secondary | ICD-10-CM | POA: Diagnosis not present

## 2018-09-13 DIAGNOSIS — M5413 Radiculopathy, cervicothoracic region: Secondary | ICD-10-CM | POA: Diagnosis not present

## 2018-09-29 DIAGNOSIS — E1159 Type 2 diabetes mellitus with other circulatory complications: Secondary | ICD-10-CM | POA: Diagnosis not present

## 2018-09-29 DIAGNOSIS — E782 Mixed hyperlipidemia: Secondary | ICD-10-CM | POA: Diagnosis not present

## 2018-09-29 DIAGNOSIS — L608 Other nail disorders: Secondary | ICD-10-CM | POA: Diagnosis not present

## 2018-09-29 DIAGNOSIS — Z6834 Body mass index (BMI) 34.0-34.9, adult: Secondary | ICD-10-CM | POA: Diagnosis not present

## 2018-10-08 DIAGNOSIS — E1159 Type 2 diabetes mellitus with other circulatory complications: Secondary | ICD-10-CM | POA: Diagnosis not present

## 2018-10-08 DIAGNOSIS — E1129 Type 2 diabetes mellitus with other diabetic kidney complication: Secondary | ICD-10-CM | POA: Diagnosis not present

## 2018-10-08 DIAGNOSIS — I10 Essential (primary) hypertension: Secondary | ICD-10-CM | POA: Diagnosis not present

## 2018-10-08 DIAGNOSIS — E782 Mixed hyperlipidemia: Secondary | ICD-10-CM | POA: Diagnosis not present

## 2018-11-02 DIAGNOSIS — M25561 Pain in right knee: Secondary | ICD-10-CM | POA: Diagnosis not present

## 2018-11-03 DIAGNOSIS — L408 Other psoriasis: Secondary | ICD-10-CM | POA: Diagnosis not present

## 2018-11-03 DIAGNOSIS — Z79899 Other long term (current) drug therapy: Secondary | ICD-10-CM | POA: Diagnosis not present

## 2018-11-03 DIAGNOSIS — L304 Erythema intertrigo: Secondary | ICD-10-CM | POA: Diagnosis not present

## 2018-11-17 DIAGNOSIS — M546 Pain in thoracic spine: Secondary | ICD-10-CM | POA: Diagnosis not present

## 2018-11-17 DIAGNOSIS — M5413 Radiculopathy, cervicothoracic region: Secondary | ICD-10-CM | POA: Diagnosis not present

## 2018-11-17 DIAGNOSIS — M5442 Lumbago with sciatica, left side: Secondary | ICD-10-CM | POA: Diagnosis not present

## 2019-01-21 DIAGNOSIS — Z1211 Encounter for screening for malignant neoplasm of colon: Secondary | ICD-10-CM | POA: Diagnosis not present

## 2019-01-21 DIAGNOSIS — D120 Benign neoplasm of cecum: Secondary | ICD-10-CM | POA: Diagnosis not present

## 2019-01-21 DIAGNOSIS — K5730 Diverticulosis of large intestine without perforation or abscess without bleeding: Secondary | ICD-10-CM | POA: Diagnosis not present

## 2019-01-21 DIAGNOSIS — K635 Polyp of colon: Secondary | ICD-10-CM | POA: Diagnosis not present

## 2019-03-08 DIAGNOSIS — E782 Mixed hyperlipidemia: Secondary | ICD-10-CM | POA: Diagnosis not present

## 2019-03-08 DIAGNOSIS — E1159 Type 2 diabetes mellitus with other circulatory complications: Secondary | ICD-10-CM | POA: Diagnosis not present

## 2019-03-14 DIAGNOSIS — E1129 Type 2 diabetes mellitus with other diabetic kidney complication: Secondary | ICD-10-CM | POA: Diagnosis not present

## 2019-03-14 DIAGNOSIS — E782 Mixed hyperlipidemia: Secondary | ICD-10-CM | POA: Diagnosis not present

## 2019-03-14 DIAGNOSIS — I152 Hypertension secondary to endocrine disorders: Secondary | ICD-10-CM | POA: Diagnosis not present

## 2019-03-14 DIAGNOSIS — E1159 Type 2 diabetes mellitus with other circulatory complications: Secondary | ICD-10-CM | POA: Diagnosis not present

## 2019-03-28 DIAGNOSIS — M25561 Pain in right knee: Secondary | ICD-10-CM | POA: Diagnosis not present

## 2019-04-21 DIAGNOSIS — M5442 Lumbago with sciatica, left side: Secondary | ICD-10-CM | POA: Diagnosis not present

## 2019-04-21 DIAGNOSIS — M5413 Radiculopathy, cervicothoracic region: Secondary | ICD-10-CM | POA: Diagnosis not present

## 2019-04-21 DIAGNOSIS — M546 Pain in thoracic spine: Secondary | ICD-10-CM | POA: Diagnosis not present

## 2019-05-11 DIAGNOSIS — L400 Psoriasis vulgaris: Secondary | ICD-10-CM | POA: Diagnosis not present

## 2019-05-11 DIAGNOSIS — Z79899 Other long term (current) drug therapy: Secondary | ICD-10-CM | POA: Diagnosis not present

## 2019-05-15 ENCOUNTER — Other Ambulatory Visit: Payer: Self-pay

## 2019-05-15 ENCOUNTER — Emergency Department (HOSPITAL_BASED_OUTPATIENT_CLINIC_OR_DEPARTMENT_OTHER)
Admission: EM | Admit: 2019-05-15 | Discharge: 2019-05-15 | Disposition: A | Discharge status: Home / Self Care | Payer: BC Managed Care – PPO | Attending: Emergency Medicine | Admitting: Emergency Medicine

## 2019-05-15 ENCOUNTER — Encounter (HOSPITAL_BASED_OUTPATIENT_CLINIC_OR_DEPARTMENT_OTHER): Payer: Self-pay | Admitting: *Deleted

## 2019-05-15 DIAGNOSIS — Z79899 Other long term (current) drug therapy: Secondary | ICD-10-CM | POA: Insufficient documentation

## 2019-05-15 DIAGNOSIS — Z7982 Long term (current) use of aspirin: Secondary | ICD-10-CM | POA: Diagnosis not present

## 2019-05-15 DIAGNOSIS — I10 Essential (primary) hypertension: Secondary | ICD-10-CM | POA: Diagnosis not present

## 2019-05-15 DIAGNOSIS — E119 Type 2 diabetes mellitus without complications: Secondary | ICD-10-CM | POA: Diagnosis not present

## 2019-05-15 DIAGNOSIS — R5381 Other malaise: Secondary | ICD-10-CM | POA: Diagnosis not present

## 2019-05-15 DIAGNOSIS — Z7984 Long term (current) use of oral hypoglycemic drugs: Secondary | ICD-10-CM | POA: Insufficient documentation

## 2019-05-15 DIAGNOSIS — R5383 Other fatigue: Secondary | ICD-10-CM | POA: Diagnosis not present

## 2019-05-15 DIAGNOSIS — R531 Weakness: Secondary | ICD-10-CM | POA: Diagnosis not present

## 2019-05-15 LAB — CBC WITH DIFFERENTIAL/PLATELET
Abs Immature Granulocytes: 0.04 10*3/uL (ref 0.00–0.07)
Basophils Absolute: 0.1 10*3/uL (ref 0.0–0.1)
Basophils Relative: 1 %
Eosinophils Absolute: 0.2 10*3/uL (ref 0.0–0.5)
Eosinophils Relative: 2 %
HCT: 44.1 % (ref 36.0–46.0)
Hemoglobin: 14.5 g/dL (ref 12.0–15.0)
Immature Granulocytes: 0 %
Lymphocytes Relative: 28 %
Lymphs Abs: 2.9 10*3/uL (ref 0.7–4.0)
MCH: 29.8 pg (ref 26.0–34.0)
MCHC: 32.9 g/dL (ref 30.0–36.0)
MCV: 90.6 fL (ref 80.0–100.0)
Monocytes Absolute: 0.6 10*3/uL (ref 0.1–1.0)
Monocytes Relative: 6 %
Neutro Abs: 6.7 10*3/uL (ref 1.7–7.7)
Neutrophils Relative %: 63 %
Platelets: 396 10*3/uL (ref 150–400)
RBC: 4.87 MIL/uL (ref 3.87–5.11)
RDW: 14 % (ref 11.5–15.5)
WBC: 10.5 10*3/uL (ref 4.0–10.5)
nRBC: 0 % (ref 0.0–0.2)

## 2019-05-15 LAB — URINALYSIS, ROUTINE W REFLEX MICROSCOPIC
Bilirubin Urine: NEGATIVE
Glucose, UA: NEGATIVE mg/dL
Hgb urine dipstick: NEGATIVE
Ketones, ur: NEGATIVE mg/dL
Nitrite: NEGATIVE
Protein, ur: NEGATIVE mg/dL
Specific Gravity, Urine: <1.005 — ABNORMAL LOW (ref 1.005–1.030)
pH: 6.5 (ref 5.0–8.0)

## 2019-05-15 LAB — COMPREHENSIVE METABOLIC PANEL
ALT: 37 U/L (ref 0–44)
AST: 32 U/L (ref 15–41)
Albumin: 3.9 g/dL (ref 3.5–5.0)
Alkaline Phosphatase: 96 U/L (ref 38–126)
Anion gap: 13 (ref 5–15)
BUN: 12 mg/dL (ref 6–20)
CO2: 23 mmol/L (ref 22–32)
Calcium: 9.3 mg/dL (ref 8.9–10.3)
Chloride: 102 mmol/L (ref 98–111)
Creatinine, Ser: 0.99 mg/dL (ref 0.44–1.00)
GFR calc Af Amer: >60 mL/min (ref >60)
GFR calc non Af Amer: >60 mL/min (ref >60)
Glucose, Bld: 118 mg/dL — ABNORMAL HIGH (ref 70–99)
Potassium: 3.9 mmol/L (ref 3.5–5.1)
Sodium: 138 mmol/L (ref 135–145)
Total Bilirubin: 0.3 mg/dL (ref 0.3–1.2)
Total Protein: 7.5 g/dL (ref 6.5–8.1)

## 2019-05-15 LAB — URINALYSIS, MICROSCOPIC (REFLEX)

## 2019-05-15 LAB — CBG MONITORING, ED: Glucose-Capillary: 131 mg/dL — ABNORMAL HIGH (ref 70–99)

## 2019-05-15 MED ORDER — SODIUM CHLORIDE 0.9 % IV BOLUS
1000.0000 mL | Freq: Once | INTRAVENOUS | Status: AC
  Administered 2019-05-15: 1000 mL via INTRAVENOUS

## 2019-05-15 NOTE — ED Provider Notes (Signed)
[facility] EMERGENCY DEPARTMENT Provider Note   CSN: [id number] Arrival date & time: 05/15/19  1557     History Chief Complaint  Patient presents with  . Weakness    Deborah Salas is a 54 y.o. female.  The history is provided by the patient and medical records. No language interpreter was used.  Weakness  Deborah Salas is a 54 y.o. female who presents to the Emergency Department complaining of fatigue.  She reports increased fatigue starting about five days ago.  She initially felt blurred vision, generalized weakness five days ago and checked her sugar and it was 80, which was low for her.  Sxs have been persistent since then.  She is having associated chills, nausea, constipation.    Denies fevers, vomiting, chest pain, cough, sob, dyuria, diarrhea Has a hx/o DM, HTN, HPL, psoriasis.    No known covid 19 exposures.  She had her second covid vaccine Mar 23.      Past Medical History:  Diagnosis Date  . Diabetes mellitus without complication (HCC)   . Hypercholesterolemia   . Hypertension   . Psoriasis     Patient Active Problem List   Diagnosis Date Noted  . DIABETES MELLITUS, TYPE II 12/11/2008  . HYPERLIPIDEMIA, MILD 12/11/2008  . OBESITY, UNSPECIFIED 12/11/2008  . HYPERTENSION 12/11/2008  . EXTERNAL HEMORRHOIDS WITH OTHER COMPLICATION 12/11/2008  . BURSITIS, RIGHT ELBOW 12/11/2008  . DYSURIA 12/11/2008    Past Surgical History:  Procedure Laterality Date  . ABDOMINAL HYSTERECTOMY       OB History   No obstetric history on file.     No family history on file.  Social History   Tobacco Use  . Smoking status: Never Smoker  . Smokeless tobacco: Never Used  Substance Use Topics  . Alcohol use: No  . Drug use: No    Home Medications Prior to Admission medications   Medication Sig Start Date End Date Taking? Authorizing Provider  aspirin 81 MG tablet Take 81 mg by mouth daily.    [provider]  cephALEXin (KEFLEX) 500 MG  capsule Take 1 capsule (500 mg total) by mouth 3 (three) times daily. 08/14/15   Lyndal Pulley, MD  Liraglutide (VICTOZA [place]) Inject into the skin.    [provider]  METFORMIN HCL PO Take 500 mg by mouth 3 (three) times daily.     [provider]  olmesartan-hydrochlorothiazide (BENICAR HCT) 40-12.5 MG tablet Take 1 tablet by mouth daily.    [provider]  SIMVASTATIN PO Take 10 mg by mouth daily.     [provider]    Allergies    Patient has no known allergies.  Review of Systems   Review of Systems  Neurological: Positive for weakness.  All other systems reviewed and are negative.   Physical Exam Updated Vital Signs BP 125/77 (BP Location: Right Arm)   Pulse (!) 108   Temp 98.3 F (36.8 C) (Oral)   Resp 17   Ht 5\' 6"  (1.676 m)   Wt 95.3 kg   SpO2 97%   BMI 33.89 kg/m   Physical Exam Vitals and nursing note reviewed.  Constitutional:      Appearance: She is well-developed.  HENT:     Head: Normocephalic and atraumatic.  Cardiovascular:     Rate and Rhythm: Normal rate and regular rhythm.     Heart sounds: No murmur.  Pulmonary:     Effort: Pulmonary effort is normal. No respiratory distress.  Breath sounds: Normal breath sounds.  Abdominal:     Palpations: Abdomen is soft.     Tenderness: There is no abdominal tenderness. There is no guarding or rebound.  Musculoskeletal:        General: No swelling or tenderness.  Skin:    General: Skin is warm and dry.  Neurological:     Mental Status: She is alert and oriented to person, place, and time.     Comments: 5/5 strength in all four extremities.   Psychiatric:        Behavior: Behavior normal.     ED Results / Procedures / Treatments   Labs (all labs ordered are listed, but only abnormal results are displayed) Labs Reviewed  COMPREHENSIVE METABOLIC PANEL - Abnormal; Notable for the following components:      Result Value   Glucose, Bld 118 (*)    All other  components within normal limits  URINALYSIS, ROUTINE W REFLEX MICROSCOPIC - Abnormal; Notable for the following components:   Specific Gravity, Urine <1.005 (*)    Leukocytes,Ua TRACE (*)    All other components within normal limits  URINALYSIS, MICROSCOPIC (REFLEX) - Abnormal; Notable for the following components:   Bacteria, UA FEW (*)    All other components within normal limits  CBG MONITORING, ED - Abnormal; Notable for the following components:   Glucose-Capillary 131 (*)    All other components within normal limits  CBC WITH DIFFERENTIAL/PLATELET  CBG MONITORING, ED    EKG EKG Interpretation  Date/Time:  Sunday May 15 2019 16:18:24 EDT Ventricular Rate:  92 PR Interval:    QRS Duration: 90 QT Interval:  352 QTC Calculation: 436 R Axis:   5 Text Interpretation: Sinus rhythm Confirmed by Tilden Fossa [phone]) on 05/15/2019 4:25:41 PM   Radiology No results found.  Procedures Procedures (including critical care time)  Medications Ordered in ED Medications  sodium chloride 0.9 % bolus 1,000 mL (0 mLs Intravenous Stopped 05/15/19 1814)    ED Course  I have reviewed the triage vital signs and the nursing notes.  Pertinent labs & imaging results that were available during my care of the patient were reviewed by me and considered in my medical decision making (see chart for details).    MDM Rules/Calculators/A&P                     Pt with hx/o HTN, DM here for evaluation of malaise and fatigue, generalized weakness for five days.  She is nontoxic appearing on exam with no focal neuro exam.  Labs without significant anemia or electrolyte abnormality.  No evidence of acute infectious process by hx or exam.  Presentation is not c/w ACS, PE, dissection, CVA, sepsis.  Discussed with patient home care for fatigue.  Discussed outpatient follow up and return precautions.    Deborah Salas was evaluated in Emergency Department on 05/15/2019 for the symptoms described in the  history of present illness. She was evaluated in the context of the global COVID-19 pandemic, which necessitated consideration that the patient might be at risk for infection with the SARS-CoV-2 virus that causes COVID-19. Institutional protocols and algorithms that pertain to the evaluation of patients at risk for COVID-19 are in a state of rapid change based on information released by regulatory bodies including the CDC and federal and state organizations. These policies and algorithms were followed during the patient's care in the ED.   Final Clinical Impression(s) / ED Diagnoses Final diagnoses:  Malaise and fatigue  Rx / DC Orders ED Discharge Orders    None       Quintella Reichert, MD 05/15/19 [phone]

## 2019-05-15 NOTE — Discharge Instructions (Signed)
The cause of your symptoms was not identified today.  Stop taking your blood pressure medication until you see your family doctor.  Get rechecked if you develop any new or concerning symptoms.

## 2019-05-15 NOTE — ED Triage Notes (Signed)
Pt reports blurred vision, nausea, fatigue since Tuesday. Hx of diabetes and HTN. CBG 131.

## 2019-05-16 DIAGNOSIS — Z6833 Body mass index (BMI) 33.0-33.9, adult: Secondary | ICD-10-CM | POA: Diagnosis not present

## 2019-05-16 DIAGNOSIS — E1159 Type 2 diabetes mellitus with other circulatory complications: Secondary | ICD-10-CM | POA: Diagnosis not present

## 2019-05-16 DIAGNOSIS — I152 Hypertension secondary to endocrine disorders: Secondary | ICD-10-CM | POA: Diagnosis not present

## 2019-05-30 DIAGNOSIS — M546 Pain in thoracic spine: Secondary | ICD-10-CM | POA: Diagnosis not present

## 2019-05-30 DIAGNOSIS — M5413 Radiculopathy, cervicothoracic region: Secondary | ICD-10-CM | POA: Diagnosis not present

## 2019-06-01 DIAGNOSIS — M5413 Radiculopathy, cervicothoracic region: Secondary | ICD-10-CM | POA: Diagnosis not present

## 2019-06-01 DIAGNOSIS — M546 Pain in thoracic spine: Secondary | ICD-10-CM | POA: Diagnosis not present

## 2019-06-06 DIAGNOSIS — M546 Pain in thoracic spine: Secondary | ICD-10-CM | POA: Diagnosis not present

## 2019-06-06 DIAGNOSIS — M5413 Radiculopathy, cervicothoracic region: Secondary | ICD-10-CM | POA: Diagnosis not present

## 2019-06-10 DIAGNOSIS — M546 Pain in thoracic spine: Secondary | ICD-10-CM | POA: Diagnosis not present

## 2019-06-10 DIAGNOSIS — M5413 Radiculopathy, cervicothoracic region: Secondary | ICD-10-CM | POA: Diagnosis not present

## 2019-06-15 DIAGNOSIS — Z79899 Other long term (current) drug therapy: Secondary | ICD-10-CM | POA: Diagnosis not present

## 2019-06-15 DIAGNOSIS — L400 Psoriasis vulgaris: Secondary | ICD-10-CM | POA: Diagnosis not present

## 2019-07-04 DIAGNOSIS — E782 Mixed hyperlipidemia: Secondary | ICD-10-CM | POA: Diagnosis not present

## 2019-07-04 DIAGNOSIS — E1129 Type 2 diabetes mellitus with other diabetic kidney complication: Secondary | ICD-10-CM | POA: Diagnosis not present

## 2019-07-13 DIAGNOSIS — E1129 Type 2 diabetes mellitus with other diabetic kidney complication: Secondary | ICD-10-CM | POA: Diagnosis not present

## 2019-07-13 DIAGNOSIS — I152 Hypertension secondary to endocrine disorders: Secondary | ICD-10-CM | POA: Diagnosis not present

## 2019-07-13 DIAGNOSIS — E782 Mixed hyperlipidemia: Secondary | ICD-10-CM | POA: Diagnosis not present

## 2019-07-13 DIAGNOSIS — E1159 Type 2 diabetes mellitus with other circulatory complications: Secondary | ICD-10-CM | POA: Diagnosis not present

## 2019-07-14 DIAGNOSIS — M5413 Radiculopathy, cervicothoracic region: Secondary | ICD-10-CM | POA: Diagnosis not present

## 2019-07-14 DIAGNOSIS — M546 Pain in thoracic spine: Secondary | ICD-10-CM | POA: Diagnosis not present

## 2019-07-26 DIAGNOSIS — Z1231 Encounter for screening mammogram for malignant neoplasm of breast: Secondary | ICD-10-CM | POA: Diagnosis not present

## 2019-09-26 DIAGNOSIS — Z20828 Contact with and (suspected) exposure to other viral communicable diseases: Secondary | ICD-10-CM | POA: Diagnosis not present

## 2019-10-10 DIAGNOSIS — J020 Streptococcal pharyngitis: Secondary | ICD-10-CM | POA: Diagnosis not present

## 2019-10-10 DIAGNOSIS — Z6833 Body mass index (BMI) 33.0-33.9, adult: Secondary | ICD-10-CM | POA: Diagnosis not present

## 2019-10-19 DIAGNOSIS — E079 Disorder of thyroid, unspecified: Secondary | ICD-10-CM | POA: Diagnosis not present

## 2019-10-19 DIAGNOSIS — Z8639 Personal history of other endocrine, nutritional and metabolic disease: Secondary | ICD-10-CM | POA: Diagnosis not present

## 2019-10-19 DIAGNOSIS — R221 Localized swelling, mass and lump, neck: Secondary | ICD-10-CM | POA: Diagnosis not present

## 2019-10-19 DIAGNOSIS — Z6833 Body mass index (BMI) 33.0-33.9, adult: Secondary | ICD-10-CM | POA: Diagnosis not present

## 2019-10-24 DIAGNOSIS — R221 Localized swelling, mass and lump, neck: Secondary | ICD-10-CM | POA: Diagnosis not present

## 2019-10-24 DIAGNOSIS — Z23 Encounter for immunization: Secondary | ICD-10-CM | POA: Diagnosis not present

## 2019-11-14 DIAGNOSIS — L400 Psoriasis vulgaris: Secondary | ICD-10-CM | POA: Diagnosis not present

## 2019-11-15 DIAGNOSIS — E1129 Type 2 diabetes mellitus with other diabetic kidney complication: Secondary | ICD-10-CM | POA: Diagnosis not present

## 2019-11-15 DIAGNOSIS — E782 Mixed hyperlipidemia: Secondary | ICD-10-CM | POA: Diagnosis not present

## 2019-11-21 DIAGNOSIS — I152 Hypertension secondary to endocrine disorders: Secondary | ICD-10-CM | POA: Diagnosis not present

## 2019-11-21 DIAGNOSIS — E1159 Type 2 diabetes mellitus with other circulatory complications: Secondary | ICD-10-CM | POA: Diagnosis not present

## 2019-11-21 DIAGNOSIS — E1129 Type 2 diabetes mellitus with other diabetic kidney complication: Secondary | ICD-10-CM | POA: Diagnosis not present

## 2019-11-21 DIAGNOSIS — E782 Mixed hyperlipidemia: Secondary | ICD-10-CM | POA: Diagnosis not present

## 2019-11-23 ENCOUNTER — Ambulatory Visit: Payer: Self-pay | Admitting: Surgery

## 2019-11-23 DIAGNOSIS — E041 Nontoxic single thyroid nodule: Secondary | ICD-10-CM | POA: Diagnosis not present

## 2019-11-29 DIAGNOSIS — Z79899 Other long term (current) drug therapy: Secondary | ICD-10-CM | POA: Diagnosis not present

## 2019-11-29 DIAGNOSIS — L408 Other psoriasis: Secondary | ICD-10-CM | POA: Diagnosis not present

## 2019-12-15 NOTE — Progress Notes (Signed)
DUE TO COVID-19 ONLY ONE VISITOR IS ALLOWED TO COME WITH YOU AND STAY IN THE WAITING ROOM ONLY DURING PRE OP AND PROCEDURE DAY OF SURGERY. THE 1 VISITOR  MAY VISIT WITH YOU AFTER SURGERY IN YOUR PRIVATE ROOM DURING VISITING HOURS ONLY!  YOU NEED TO HAVE A COVID 19 TEST ON__11/16/2021 _____ @_______ , THIS TEST MUST BE DONE BEFORE SURGERY,  COVID TESTING SITE [address] [place] , IT IS ON THE RIGHT GOING OUT [address] APPROXIMATELY  2 MINUTES PAST [employer] ON THE RIGHT. ONCE YOUR COVID TEST IS COMPLETED,  PLEASE BEGIN THE QUARANTINE INSTRUCTIONS AS OUTLINED IN YOUR HANDOUT.                Deborah Salas  12/15/2019   Your procedure is scheduled on: 12/12/2019    Report to [facility] Main  Entrance   Report to admitting at      0530 AM     Call this number if you have problems the morning of surgery [phone]    REMEMBER: NO  SOLID FOOD CANDY OR GUM AFTER MIDNIGHT. CLEAR LIQUIDS UNTIL  0430am       . NOTHING BY MOUTH EXCEPT CLEAR LIQUIDS UNTIL    . PLEASE FINISH G2  DRINK PER SURGEON ORDER  WHICH NEEDS TO BE COMPLETED AT 0430am     .      CLEAR LIQUID DIET   Foods Allowed                                                                    Coffee and tea, regular and decaf                            Fruit ices (not with fruit pulp)                                      Iced Popsicles                                    Carbonated beverages, regular and diet                                    Cranberry, grape and apple juices Sports drinks like Gatorade Lightly seasoned clear broth or consume(fat free) Sugar, honey syrup ___________________________________________________________________      BRUSH YOUR TEETH MORNING OF SURGERY AND RINSE YOUR MOUTH OUT, NO CHEWING GUM CANDY OR MINTS.     Take these medicines the morning of surgery with A SIP OF WATER:      None  DO NOT TAKE ANY DIABETIC MEDICATIONS DAY OF YOUR SURGERY                                You may not have any metal on your body including hair pins and  piercings  Do not wear jewelry, make-up, lotions, powders or perfumes, deodorant             Do not wear nail polish on your fingernails.  Do not shave  48 hours prior to surgery.              Men may shave face and neck.   Do not bring valuables to the hospital. [place].  Contacts, dentures or bridgework may not be worn into surgery.  Leave suitcase in the car. After surgery it may be brought to your room.     Patients discharged the day of surgery will not be allowed to drive home. IF YOU ARE HAVING SURGERY AND GOING HOME THE SAME DAY, YOU MUST HAVE AN ADULT TO DRIVE YOU HOME AND BE WITH YOU FOR 24 HOURS. YOU MAY GO HOME BY TAXI OR UBER OR ORTHERWISE, BUT AN ADULT MUST ACCOMPANY YOU HOME AND STAY WITH YOU FOR 24 HOURS.  Name and phone number of your driver:  Special Instructions: N/A              Please read over the following fact sheets you were given: _____________________________________________________________________  [facility] - Preparing for Surgery Before surgery, you can play an important role.  Because skin is not sterile, your skin needs to be as free of germs as possible.  You can reduce the number of germs on your skin by washing with CHG (chlorahexidine gluconate) soap before surgery.  CHG is an antiseptic cleaner which kills germs and bonds with the skin to continue killing germs even after washing. Please DO NOT use if you have an allergy to CHG or antibacterial soaps.  If your skin becomes reddened/irritated stop using the CHG and inform your nurse when you arrive at Short Stay. Do not shave (including legs and underarms) for at least 48 hours prior to the first CHG shower.  You may shave your face/neck. Please follow these instructions carefully:  1.  Shower with CHG Soap the night before surgery and the  morning of  Surgery.  2.  If you choose to wash your hair, wash your hair first as usual with your  normal  shampoo.  3.  After you shampoo, rinse your hair and body thoroughly to remove the  shampoo.                           4.  Use CHG as you would any other liquid soap.  You can apply chg directly  to the skin and wash                       Gently with a scrungie or clean washcloth.  5.  Apply the CHG Soap to your body ONLY FROM THE NECK DOWN.   Do not use on face/ open                           Wound or open sores. Avoid contact with eyes, ears mouth and genitals (private parts).                       Wash face,  Genitals (private parts) with your normal soap.             6.  Wash thoroughly, paying special attention to the area where your surgery  will be performed.  7.  Thoroughly rinse your body with warm water from the neck down.  8.  DO NOT shower/wash with your normal soap after using and rinsing off  the CHG Soap.                9.  Pat yourself dry with a clean towel.            10.  Wear clean pajamas.            11.  Place clean sheets on your bed the night of your first shower and do not  sleep with pets. Day of Surgery : Do not apply any lotions/deodorants the morning of surgery.  Please wear clean clothes to the hospital/surgery center.  FAILURE TO FOLLOW THESE INSTRUCTIONS MAY RESULT IN THE CANCELLATION OF YOUR SURGERY PATIENT SIGNATURE_________________________________  NURSE SIGNATURE__________________________________  ________________________________________________________________________

## 2019-12-19 ENCOUNTER — Encounter (HOSPITAL_COMMUNITY)
Admission: RE | Admit: 2019-12-19 | Discharge: 2019-12-19 | Disposition: A | Discharge status: Home / Self Care | Payer: BC Managed Care – PPO | Source: Ambulatory Visit | Attending: Surgery | Admitting: Surgery

## 2019-12-19 ENCOUNTER — Ambulatory Visit (HOSPITAL_COMMUNITY)
Admission: RE | Admit: 2019-12-19 | Discharge: 2019-12-19 | Disposition: A | Discharge status: Home / Self Care | Payer: BC Managed Care – PPO | Source: Ambulatory Visit | Attending: Anesthesiology | Admitting: Anesthesiology

## 2019-12-19 ENCOUNTER — Encounter (HOSPITAL_COMMUNITY): Payer: Self-pay

## 2019-12-19 ENCOUNTER — Other Ambulatory Visit: Payer: Self-pay

## 2019-12-19 DIAGNOSIS — Z01818 Encounter for other preprocedural examination: Secondary | ICD-10-CM | POA: Diagnosis not present

## 2019-12-19 DIAGNOSIS — Z01810 Encounter for preprocedural cardiovascular examination: Secondary | ICD-10-CM | POA: Insufficient documentation

## 2019-12-19 HISTORY — DX: Unspecified osteoarthritis, unspecified site: M19.90

## 2019-12-19 LAB — CBC
HCT: 42.3 % (ref 36.0–46.0)
Hemoglobin: 14 g/dL (ref 12.0–15.0)
MCH: 30.2 pg (ref 26.0–34.0)
MCHC: 33.1 g/dL (ref 30.0–36.0)
MCV: 91.2 fL (ref 80.0–100.0)
Platelets: 385 10*3/uL (ref 150–400)
RBC: 4.64 MIL/uL (ref 3.87–5.11)
RDW: 14.3 % (ref 11.5–15.5)
WBC: 8.7 10*3/uL (ref 4.0–10.5)
nRBC: 0 % (ref 0.0–0.2)

## 2019-12-19 LAB — BASIC METABOLIC PANEL
Anion gap: 8 (ref 5–15)
BUN: 10 mg/dL (ref 6–20)
CO2: 25 mmol/L (ref 22–32)
Calcium: 9.1 mg/dL (ref 8.9–10.3)
Chloride: 107 mmol/L (ref 98–111)
Creatinine, Ser: 0.72 mg/dL (ref 0.44–1.00)
GFR, Estimated: >60 mL/min (ref >60)
Glucose, Bld: 103 mg/dL — ABNORMAL HIGH (ref 70–99)
Potassium: 3.9 mmol/L (ref 3.5–5.1)
Sodium: 140 mmol/L (ref 135–145)

## 2019-12-19 LAB — GLUCOSE, CAPILLARY: Glucose-Capillary: 104 mg/dL — ABNORMAL HIGH (ref 70–99)

## 2019-12-19 LAB — HEMOGLOBIN A1C
Hgb A1c MFr Bld: 5.9 % — ABNORMAL HIGH (ref 4.8–5.6)
Mean Plasma Glucose: 122.63 mg/dL

## 2019-12-25 ENCOUNTER — Encounter (HOSPITAL_COMMUNITY): Payer: Self-pay | Admitting: Surgery

## 2019-12-25 DIAGNOSIS — E041 Nontoxic single thyroid nodule: Secondary | ICD-10-CM | POA: Diagnosis present

## 2019-12-25 NOTE — H&P (Signed)
General Surgery [facility]  Deborah Salas DOB: 1965/12/25 Single / Language: [place] / Race: Undefined Female   History of Present Illness   The patient is a 54 year old female who presents with a thyroid nodule.  CHIEF COMPLAINT: right thyroid nodule  Patient is referred by Dr. Charlott Rakes for surgical evaluation and management of a dominant right thyroid nodule. Patient was originally diagnosed in 2019. She underwent ultrasound followed by fine-needle aspiration biopsy of the dominant 3.5 cm nodule in the right thyroid lobe. By report, the cytopathology was benign. We will obtain a copy of that report. Patient is noted over the past 2 years progressive symptoms with some dysphagia to both solids and liquids. She also notes a globus sensation frequently clearing her throat and causing a chronic cough. She relates this to the nodule in the right side of the thyroid. She presents today to discuss surgical excision. Patient has no prior history of thyroid disease. She has never been on thyroid medication. She does take methotrexate for psoriasis. She works as a Catering manager. She is accompanied today by one of her friends.   Past Surgical History Foot Surgery  Bilateral.  Diagnostic Studies History Colonoscopy  within last year  Allergies No Known Drug Allergies  [11/23/2019]: Allergies Reconciled   Medication History  Losartan Potassium (25MG  Tablet, Oral) Active. metFORMIN HCl (1000MG  Tablet, Oral) Active. Ozempic (1 MG/DOSE) (4MG /3ML Soln Pen-inj, Subcutaneous) Active. Tremfya (100MG /ML Soln Pref Syr, Subcutaneous) Active. Simvastatin (10MG  Tablet, Oral) Active. Methotrexate Sodium (2.5MG  Tablet, Oral) Active. Medications Reconciled  Social History  Caffeine use  Coffee, Tea.  Family History  Cerebrovascular Accident  Father. Depression  Family Members In General. Heart disease in female family member before age 87   Thyroid problems  Sister.  Pregnancy / Birth History  Age at menarche  13 years. Contraceptive History  Oral contraceptives. Gravida  0  Other Problems  Arthritis  Diabetes Mellitus  Hemorrhoids  Hypercholesterolemia  Oophorectomy   Review of Systems  General Not Present- Appetite Loss, Chills, Fatigue, Fever, Night Sweats, Weight Gain and Weight Loss. HEENT Present- Hoarseness and Wears glasses/contact lenses. Not Present- Earache, Hearing Loss, Nose Bleed, Oral Ulcers, Ringing in the Ears, Seasonal Allergies, Sinus Pain, Sore Throat, Visual Disturbances and Yellow Eyes. Gastrointestinal Present- Hemorrhoids. Not Present- Abdominal Pain, Bloating, Bloody Stool, Change in Bowel Habits, Chronic diarrhea, Constipation, Difficulty Swallowing, Excessive gas, Gets full quickly at meals, Indigestion, Nausea, Rectal Pain and Vomiting. Female Genitourinary Present- Nocturia. Not Present- Frequency, Painful Urination, Pelvic Pain and Urgency.  Vitals Weight: 214.8 lb Height: 66in Body Surface Area: 2.06 m Body Mass Index: 34.67 kg/m  Temp.: 41F  Pulse: 102 (Regular)  BP: 138/82(Sitting, Left Arm, Standard)  Physical Exam  GENERAL APPEARANCE Development: normal Nutritional status: normal Gross deformities: none  SKIN Rash, lesions, ulcers: none Induration, erythema: none Nodules: none palpable  EYES Conjunctiva and lids: normal Pupils: equal and reactive Iris: normal bilaterally  EARS, NOSE, MOUTH, THROAT External ears: no lesion or deformity External nose: no lesion or deformity Hearing: grossly normal Due to Covid-19 pandemic, patient is wearing a mask.  NECK Symmetric: no Trachea: midline Thyroid: Palpation of the left thyroid lobe shows no significant nodularity or mass. There is no tenderness. Palpation of the right thyroid lobe shows a dominant nodule measuring at least 3 cm in size, relatively firm, smooth, mobile, with mild discomfort to  palpation in the inferior right lobe. There is no associated lymphadenopathy.  CHEST Respiratory effort: normal Retraction  or accessory muscle use: no Breath sounds: normal bilaterally Rales, rhonchi, wheeze: none  CARDIOVASCULAR Auscultation: regular rhythm, normal rate Murmurs: none Pulses: radial pulse 2+ palpable Lower extremity edema: none  MUSCULOSKELETAL Station and gait: normal Digits and nails: no clubbing or cyanosis Muscle strength: grossly normal all extremities Range of motion: grossly normal all extremities Deformity: none  LYMPHATIC Cervical: none palpable Supraclavicular: none palpable  PSYCHIATRIC Oriented to person, place, and time: yes Mood and affect: normal for situation Judgment and insight: appropriate for situation    Assessment & Plan   RIGHT THYROID NODULE (E04.1)  Patient is referred by her primary care physician for consideration for thyroid lobectomy for management of an enlarging thyroid nodule.  Patient provided with a copy of "The Thyroid Book: Medical and Surgical Treatment of Thyroid Problems", published by [employer], 16 pages. Book reviewed and explained to patient during visit today.  Patient has a 3.5 cm solid nodule in the right thyroid lobe. This is slightly increased in size compared to her prior study in 2019. She has had previous fine-needle aspiration biopsy which shows this to be benign. However, she has developed compressive symptoms including globus sensation and intermittent solid and liquid dysphagia. She is interested in proceeding with thyroid lobectomy. We discussed the procedure today at length. We discussed the risk and benefits of surgery including the risk of recurrent laryngeal nerve injury and injury to parathyroid glands. We discussed the size and location of the surgical incision. We discussed the hospital stay to be anticipated. We discussed her postoperative recovery and return to work. We discussed the  potential need for lifelong thyroid hormone supplementation. The patient understands and wishes to proceed with surgery in the near future.  The risks and benefits of the procedure have been discussed at length with the patient. The patient understands the proposed procedure, potential alternative treatments, and the course of recovery to be expected. All of the patient's questions have been answered at this time. The patient wishes to proceed with surgery.  Darnell Level, MD [facility] Office: [phone]

## 2019-12-27 ENCOUNTER — Other Ambulatory Visit (HOSPITAL_COMMUNITY)
Admission: RE | Admit: 2019-12-27 | Discharge: 2019-12-27 | Disposition: A | Discharge status: Home / Self Care | Payer: BC Managed Care – PPO | Source: Ambulatory Visit | Attending: Surgery | Admitting: Surgery

## 2019-12-27 DIAGNOSIS — Z01812 Encounter for preprocedural laboratory examination: Secondary | ICD-10-CM | POA: Insufficient documentation

## 2019-12-27 DIAGNOSIS — Z20822 Contact with and (suspected) exposure to covid-19: Secondary | ICD-10-CM | POA: Diagnosis not present

## 2019-12-27 LAB — SARS CORONAVIRUS 2 (TAT 6-24 HRS): SARS Coronavirus 2: NEGATIVE

## 2019-12-30 ENCOUNTER — Encounter (HOSPITAL_COMMUNITY)
Admission: RE | Disposition: A | Discharge status: Home / Self Care | Payer: Self-pay | Source: Home / Self Care | Attending: Surgery

## 2019-12-30 ENCOUNTER — Encounter (HOSPITAL_COMMUNITY): Payer: Self-pay | Admitting: Surgery

## 2019-12-30 ENCOUNTER — Ambulatory Visit (HOSPITAL_COMMUNITY)
Admission: RE | Admit: 2019-12-30 | Discharge: 2019-12-31 | Disposition: A | Discharge status: Home / Self Care | Payer: BC Managed Care – PPO | Attending: Surgery | Admitting: Surgery

## 2019-12-30 ENCOUNTER — Ambulatory Visit (HOSPITAL_COMMUNITY): Payer: BC Managed Care – PPO | Admitting: Anesthesiology

## 2019-12-30 ENCOUNTER — Other Ambulatory Visit: Payer: Self-pay

## 2019-12-30 DIAGNOSIS — E063 Autoimmune thyroiditis: Secondary | ICD-10-CM | POA: Insufficient documentation

## 2019-12-30 DIAGNOSIS — E041 Nontoxic single thyroid nodule: Secondary | ICD-10-CM | POA: Diagnosis present

## 2019-12-30 DIAGNOSIS — D34 Benign neoplasm of thyroid gland: Secondary | ICD-10-CM | POA: Diagnosis not present

## 2019-12-30 DIAGNOSIS — Z7984 Long term (current) use of oral hypoglycemic drugs: Secondary | ICD-10-CM | POA: Diagnosis not present

## 2019-12-30 DIAGNOSIS — I10 Essential (primary) hypertension: Secondary | ICD-10-CM | POA: Diagnosis not present

## 2019-12-30 DIAGNOSIS — Z01810 Encounter for preprocedural cardiovascular examination: Secondary | ICD-10-CM

## 2019-12-30 DIAGNOSIS — Z79899 Other long term (current) drug therapy: Secondary | ICD-10-CM | POA: Diagnosis not present

## 2019-12-30 DIAGNOSIS — E7800 Pure hypercholesterolemia, unspecified: Secondary | ICD-10-CM | POA: Diagnosis not present

## 2019-12-30 DIAGNOSIS — E119 Type 2 diabetes mellitus without complications: Secondary | ICD-10-CM | POA: Diagnosis not present

## 2019-12-30 HISTORY — PX: THYROID LOBECTOMY: SHX420

## 2019-12-30 LAB — GLUCOSE, CAPILLARY
Glucose-Capillary: 113 mg/dL — ABNORMAL HIGH (ref 70–99)
Glucose-Capillary: 108 mg/dL — ABNORMAL HIGH (ref 70–99)

## 2019-12-30 SURGERY — LOBECTOMY, THYROID
Anesthesia: General | Site: Neck | Laterality: Right

## 2019-12-30 MED ORDER — FENTANYL CITRATE (PF) 100 MCG/2ML IJ SOLN
INTRAMUSCULAR | Status: DC | PRN
  Administered 2019-12-30 (×2): 50 ug via INTRAVENOUS

## 2019-12-30 MED ORDER — ONDANSETRON HCL 4 MG/2ML IJ SOLN: 4.0000 mg | Freq: Once | INTRAMUSCULAR | Status: DC | PRN

## 2019-12-30 MED ORDER — METFORMIN HCL 500 MG PO TABS
1000.0000 mg | ORAL_TABLET | Freq: Every day | ORAL | Status: DC
  Administered 2019-12-30 – 2019-12-31 (×2): 1000 mg via ORAL
  Filled 2019-12-30 (×2): qty 2

## 2019-12-30 MED ORDER — DEXAMETHASONE SODIUM PHOSPHATE 10 MG/ML IJ SOLN
INTRAMUSCULAR | Status: AC
  Filled 2019-12-30: qty 1

## 2019-12-30 MED ORDER — HYDROMORPHONE HCL 1 MG/ML IJ SOLN
0.2500 mg | INTRAMUSCULAR | Status: DC | PRN
  Administered 2019-12-30 (×2): 0.25 mg via INTRAVENOUS

## 2019-12-30 MED ORDER — LOSARTAN POTASSIUM 25 MG PO TABS
25.0000 mg | ORAL_TABLET | Freq: Every day | ORAL | Status: DC
  Administered 2019-12-30 – 2019-12-31 (×2): 25 mg via ORAL
  Filled 2019-12-30 (×2): qty 1

## 2019-12-30 MED ORDER — SODIUM CHLORIDE 0.45 % IV SOLN: INTRAVENOUS | Status: DC

## 2019-12-30 MED ORDER — LACTATED RINGERS IV SOLN: INTRAVENOUS | Status: DC

## 2019-12-30 MED ORDER — ONDANSETRON HCL 4 MG/2ML IJ SOLN
INTRAMUSCULAR | Status: AC
  Filled 2019-12-30: qty 2

## 2019-12-30 MED ORDER — LIDOCAINE 2% (20 MG/ML) 5 ML SYRINGE
INTRAMUSCULAR | Status: DC | PRN
  Administered 2019-12-30: 60 mg via INTRAVENOUS

## 2019-12-30 MED ORDER — CEFAZOLIN SODIUM-DEXTROSE 2-4 GM/100ML-% IV SOLN
2.0000 g | INTRAVENOUS | Status: AC
  Administered 2019-12-30: 2 g via INTRAVENOUS
  Filled 2019-12-30: qty 100

## 2019-12-30 MED ORDER — SUGAMMADEX SODIUM 200 MG/2ML IV SOLN
INTRAVENOUS | Status: DC | PRN
  Administered 2019-12-30: 200 mg via INTRAVENOUS

## 2019-12-30 MED ORDER — CHLORHEXIDINE GLUCONATE 0.12 % MT SOLN
15.0000 mL | Freq: Once | OROMUCOSAL | Status: AC
  Administered 2019-12-30: 15 mL via OROMUCOSAL

## 2019-12-30 MED ORDER — ACETAMINOPHEN 650 MG RE SUPP: 650.0000 mg | Freq: Four times a day (QID) | RECTAL | Status: DC | PRN

## 2019-12-30 MED ORDER — LIDOCAINE 2% (20 MG/ML) 5 ML SYRINGE
INTRAMUSCULAR | Status: AC
  Filled 2019-12-30: qty 5

## 2019-12-30 MED ORDER — OXYCODONE HCL 5 MG PO TABS
5.0000 mg | ORAL_TABLET | ORAL | Status: DC | PRN
  Administered 2019-12-30: 5 mg via ORAL
  Filled 2019-12-30: qty 1

## 2019-12-30 MED ORDER — MIDAZOLAM HCL 5 MG/5ML IJ SOLN
INTRAMUSCULAR | Status: DC | PRN
  Administered 2019-12-30: 2 mg via INTRAVENOUS

## 2019-12-30 MED ORDER — DEXAMETHASONE SODIUM PHOSPHATE 4 MG/ML IJ SOLN
INTRAMUSCULAR | Status: DC | PRN
  Administered 2019-12-30: 10 mg via INTRAVENOUS

## 2019-12-30 MED ORDER — PROPOFOL 10 MG/ML IV BOLUS
INTRAVENOUS | Status: DC | PRN
  Administered 2019-12-30: 130 mg via INTRAVENOUS

## 2019-12-30 MED ORDER — ACETAMINOPHEN 325 MG PO TABS: 650.0000 mg | ORAL_TABLET | Freq: Four times a day (QID) | ORAL | Status: DC | PRN

## 2019-12-30 MED ORDER — PHENYLEPHRINE 40 MCG/ML (10ML) SYRINGE FOR IV PUSH (FOR BLOOD PRESSURE SUPPORT)
PREFILLED_SYRINGE | INTRAVENOUS | Status: DC | PRN
  Administered 2019-12-30: 80 ug via INTRAVENOUS
  Administered 2019-12-30: 40 ug via INTRAVENOUS
  Administered 2019-12-30: 80 ug via INTRAVENOUS

## 2019-12-30 MED ORDER — HYDROMORPHONE HCL 1 MG/ML IJ SOLN
INTRAMUSCULAR | Status: AC
  Administered 2019-12-30: 0.25 mg via INTRAVENOUS
  Filled 2019-12-30: qty 1

## 2019-12-30 MED ORDER — ROCURONIUM BROMIDE 10 MG/ML (PF) SYRINGE
PREFILLED_SYRINGE | INTRAVENOUS | Status: AC
  Filled 2019-12-30: qty 10

## 2019-12-30 MED ORDER — 0.9 % SODIUM CHLORIDE (POUR BTL) OPTIME
TOPICAL | Status: DC | PRN
  Administered 2019-12-30: 1000 mL

## 2019-12-30 MED ORDER — ORAL CARE MOUTH RINSE: 15.0000 mL | Freq: Once | OROMUCOSAL | Status: AC

## 2019-12-30 MED ORDER — MIDAZOLAM HCL 2 MG/2ML IJ SOLN
INTRAMUSCULAR | Status: AC
  Filled 2019-12-30: qty 2

## 2019-12-30 MED ORDER — FENTANYL CITRATE (PF) 100 MCG/2ML IJ SOLN
INTRAMUSCULAR | Status: AC
  Filled 2019-12-30: qty 2

## 2019-12-30 MED ORDER — ONDANSETRON HCL 4 MG/2ML IJ SOLN
INTRAMUSCULAR | Status: DC | PRN
  Administered 2019-12-30: 4 mg via INTRAVENOUS

## 2019-12-30 MED ORDER — ONDANSETRON 4 MG PO TBDP: 4.0000 mg | ORAL_TABLET | Freq: Four times a day (QID) | ORAL | Status: DC | PRN

## 2019-12-30 MED ORDER — CHLORHEXIDINE GLUCONATE CLOTH 2 % EX PADS: 6.0000 | MEDICATED_PAD | Freq: Once | CUTANEOUS | Status: DC

## 2019-12-30 MED ORDER — TRAMADOL HCL 50 MG PO TABS: 50.0000 mg | ORAL_TABLET | Freq: Four times a day (QID) | ORAL | 0 refills | Status: AC | PRN

## 2019-12-30 MED ORDER — ROCURONIUM BROMIDE 10 MG/ML (PF) SYRINGE
PREFILLED_SYRINGE | INTRAVENOUS | Status: DC | PRN
  Administered 2019-12-30: 60 mg via INTRAVENOUS
  Administered 2019-12-30: 20 mg via INTRAVENOUS

## 2019-12-30 MED ORDER — ONDANSETRON HCL 4 MG/2ML IJ SOLN: 4.0000 mg | Freq: Four times a day (QID) | INTRAMUSCULAR | Status: DC | PRN

## 2019-12-30 MED ORDER — HYDROMORPHONE HCL 1 MG/ML IJ SOLN: 1.0000 mg | INTRAMUSCULAR | Status: DC | PRN

## 2019-12-30 MED ORDER — TRAMADOL HCL 50 MG PO TABS: 50.0000 mg | ORAL_TABLET | Freq: Four times a day (QID) | ORAL | Status: DC | PRN

## 2019-12-30 SURGICAL SUPPLY — 28 items
ATTRACTOMAT 16X20 MAGNETIC DRP (DRAPES) ×2 IMPLANT
BLADE SURG 15 STRL LF DISP TIS (BLADE) ×1 IMPLANT
BLADE SURG 15 STRL SS (BLADE) ×1
CHLORAPREP W/TINT 26 (MISCELLANEOUS) ×2 IMPLANT
CLIP VESOCCLUDE MED 6/CT (CLIP) ×6 IMPLANT
CLIP VESOCCLUDE SM WIDE 6/CT (CLIP) ×4 IMPLANT
COVER SURGICAL LIGHT HANDLE (MISCELLANEOUS) ×2 IMPLANT
COVER WAND RF STERILE (DRAPES) ×2 IMPLANT
DERMABOND ADVANCED (GAUZE/BANDAGES/DRESSINGS) ×1
DERMABOND ADVANCED .7 DNX12 (GAUZE/BANDAGES/DRESSINGS) ×1 IMPLANT
DRAPE LAPAROTOMY T 98X78 PEDS (DRAPES) ×2 IMPLANT
ELECT PENCIL ROCKER SW 15FT (MISCELLANEOUS) ×2 IMPLANT
ELECT REM PT RETURN 15FT ADLT (MISCELLANEOUS) ×2 IMPLANT
GAUZE 4X4 16PLY RFD (DISPOSABLE) ×2 IMPLANT
GLOVE SURG ORTHO 8.0 STRL STRW (GLOVE) ×2 IMPLANT
GOWN STRL REUS W/TWL XL LVL3 (GOWN DISPOSABLE) ×4 IMPLANT
HEMOSTAT SURGICEL 2X4 FIBR (HEMOSTASIS) ×2 IMPLANT
ILLUMINATOR WAVEGUIDE N/F (MISCELLANEOUS) ×2 IMPLANT
KIT BASIN OR (CUSTOM PROCEDURE TRAY) ×2 IMPLANT
KIT TURNOVER KIT A (KITS) IMPLANT
PACK BASIC VI WITH GOWN DISP (CUSTOM PROCEDURE TRAY) ×2 IMPLANT
SHEARS HARMONIC 9CM CVD (BLADE) ×2 IMPLANT
SUT MNCRL AB 4-0 PS2 18 (SUTURE) ×2 IMPLANT
SUT VIC AB 3-0 SH 18 (SUTURE) ×4 IMPLANT
SYR BULB IRRIG 60ML STRL (SYRINGE) ×2 IMPLANT
TOWEL OR 17X26 10 PK STRL BLUE (TOWEL DISPOSABLE) ×2 IMPLANT
TOWEL OR NON WOVEN STRL DISP B (DISPOSABLE) ×2 IMPLANT
TUBING CONNECTING 10 (TUBING) ×2 IMPLANT

## 2019-12-30 NOTE — Anesthesia Preprocedure Evaluation (Signed)
Anesthesia Evaluation  Patient identified by MRN, date of birth, ID band Patient awake    Reviewed: Allergy & Precautions, NPO status , Patient's Chart, lab work & pertinent test results  Airway Mallampati: II  TM Distance: >3 FB Neck ROM: Full    Dental no notable dental hx.    Pulmonary neg pulmonary ROS,    Pulmonary exam normal breath sounds clear to auscultation       Cardiovascular hypertension, Normal cardiovascular exam Rhythm:Regular Rate:Normal     Neuro/Psych negative neurological ROS  negative psych ROS   GI/Hepatic negative GI ROS, Neg liver ROS,   Endo/Other  diabetes, Type 2  Renal/GU negative Renal ROS  negative genitourinary   Musculoskeletal negative musculoskeletal ROS (+)   Abdominal   Peds negative pediatric ROS (+)  Hematology negative hematology ROS (+)   Anesthesia Other Findings   Reproductive/Obstetrics negative OB ROS                             Anesthesia Physical Anesthesia Plan  ASA: II  Anesthesia Plan: General   Post-op Pain Management:    Induction: Intravenous  PONV Risk Score and Plan: 3 and Ondansetron, Dexamethasone, Midazolam and Treatment may vary due to age or medical condition  Airway Management Planned: Oral ETT  Additional Equipment:   Intra-op Plan:   Post-operative Plan: Extubation in OR  Informed Consent: I have reviewed the patients History and Physical, chart, labs and discussed the procedure including the risks, benefits and alternatives for the proposed anesthesia with the patient or authorized representative who has indicated his/her understanding and acceptance.     Dental advisory given  Plan Discussed with: CRNA and Surgeon  Anesthesia Plan Comments:         Anesthesia Quick Evaluation

## 2019-12-30 NOTE — Anesthesia Postprocedure Evaluation (Signed)
Anesthesia Post Note  Patient: Building services engineer  Procedure(s) Performed: RIGHT THYROID LOBECTOMY (Right Neck)     Patient location during evaluation: PACU Anesthesia Type: General Level of consciousness: awake and alert Pain management: pain level controlled Vital Signs Assessment: post-procedure vital signs reviewed and stable Respiratory status: spontaneous breathing, nonlabored ventilation, respiratory function stable and patient connected to nasal cannula oxygen Cardiovascular status: blood pressure returned to baseline and stable Postop Assessment: no apparent nausea or vomiting Anesthetic complications: no   No complications documented.  Last Vitals:  Vitals:   12/30/19 1015 12/30/19 1045  BP: (!) 150/76 (!) 144/78  Pulse: 69 80  Resp: 12 19  Temp:    SpO2: 95% 96%    Last Pain:  Vitals:   12/30/19 1045  TempSrc:   PainSc: Asleep                 Riley Hallum S

## 2019-12-30 NOTE — Discharge Instructions (Signed)
[facility]  THYROID & PARATHYROID SURGERY:  POST-OP INSTRUCTIONS  Always review your discharge instruction sheet from the facility where your surgery was performed.  A prescription for pain medication may be given to you upon discharge.  Take your pain medication as prescribed.  If narcotic pain medicine is not needed, then you may take acetaminophen (Tylenol) or ibuprofen (Advil) as needed.  Take your usually prescribed medications unless otherwise directed.  If you need a refill on your pain medication, please contact our office during regular business hours.  Prescriptions cannot be processed by our office after 5 pm or on weekends.  Start with a light diet upon arrival home, such as soup and crackers or toast.  Be sure to drink plenty of fluids daily.  Resume your normal diet the day after surgery.  Most patients will experience some swelling and bruising on the chest and neck area.  Ice packs will help.  Swelling and bruising can take several days to resolve.   It is common to experience some constipation after surgery.  Increasing fluid intake and taking a stool softener (Colace) will usually help or prevent this problem.  A mild laxative (Milk of Magnesia or Miralax) should be taken according to package directions if there has been no bowel movement after 48 hours.  You have steri-strips and a gauze dressing over your incision.  You may remove the gauze bandage on the second day after surgery, and you may shower at that time.  Leave your steri-strips (small skin tapes) in place directly over the incision.  These strips should remain on the skin for 5-7 days and then be removed.  You may get them wet in the shower and pat them dry.  You may resume regular (light) daily activities beginning the next day (such as daily self-care, walking, climbing stairs) gradually increasing activities as tolerated.  You may have sexual intercourse when it is comfortable.  Refrain from  any heavy lifting or straining until approved by your doctor.  You may drive when you no longer are taking prescription pain medication, you can comfortably wear a seatbelt, and you can safely maneuver your car and apply brakes.  You should see your doctor in the office for a follow-up appointment approximately three weeks after your surgery.  Make sure that you call for this appointment within a day or two after you arrive home to insure a convenient appointment time.  WHEN TO CALL YOUR DOCTOR: -- Fever greater than 101.5 -- Inability to urinate -- Nausea and/or vomiting - persistent -- Extreme swelling or bruising -- Continued bleeding from incision -- Increased pain, redness, or drainage from the incision -- Difficulty swallowing or breathing -- Muscle cramping or spasms -- Numbness or tingling in hands or around lips  The clinic staff is available to answer your questions during regular business hours.  Please don't hesitate to call and ask to speak to one of the nurses if you have concerns.  Deborah Thissen, MD [facility] Office: [phone] 

## 2019-12-30 NOTE — Op Note (Signed)
Procedure Note  Pre-operative Diagnosis:  Right thyroid nodule  Post-operative Diagnosis:  same  Surgeon:  Darnell Level, MD  Assistant:  none   Procedure:  Right thyroid lobectomy and isthmusectomy  Anesthesia:  General  Estimated Blood Loss:  minimal  Drains: none         Specimen: thyroid lobe to pathology  Indications:  Patient is referred by Dr. Charlott Rakes for surgical evaluation and management of a dominant right thyroid nodule. Patient was originally diagnosed in 2019. She underwent ultrasound followed by fine-needle aspiration biopsy of the dominant 3.5 cm nodule in the right thyroid lobe. By report, the cytopathology was benign. We will obtain a copy of that report. Patient is noted over the past 2 years progressive symptoms with some dysphagia to both solids and liquids. She also notes a globus sensation frequently clearing her throat and causing a chronic cough. She relates this to the nodule in the right side of the thyroid. She presents today for surgical excision.   Procedure Details: Procedure was done in OR #1 at the [facility]. The patient was brought to the operating room and placed in a supine position on the operating room table. Following administration of general anesthesia, the patient was positioned and then prepped and draped in the usual aseptic fashion. After ascertaining that an adequate level of anesthesia had been achieved, a small Kocher incision was made with #15 blade. Dissection was carried through subcutaneous tissues and platysma. Hemostasis was achieved with the electrocautery. Skin flaps were elevated cephalad and caudad from the thyroid notch to the sternal notch. A self-retaining retractor was placed for exposure. Strap muscles were incised in the midline and dissection was begun on the right side. Strap muscles were reflected laterally. The right thyroid lobe was moderately enlarged with a dominant nodule in the inferior pole. The  lobe was gently mobilized with blunt dissection. Superior pole vessels were dissected out and divided individually between small and medium ligaclips with the harmonic scalpel. The thyroid lobe was rolled anteriorly. Branches of the inferior thyroid artery were divided between small ligaclips with the harmonic scalpel. Inferior venous tributaries were divided between ligaclips. Both the superior and inferior parathyroid glands were identified and preserved on their vascular pedicles. The recurrent laryngeal nerve was identified and preserved along its course. The ligament of Allyson Sabal was released with the electrocautery and the gland was mobilized onto the anterior trachea. Isthmus was mobilized across the midline. There was a moderate sized pyramidal lobe present which was resected off of the thyroid cartilage down to the isthmus and removed en bloc with the isthmus. The thyroid parenchyma was transected at the junction of the isthmus and contralateral thyroid lobe with the harmonic scalpel. The thyroid lobe and isthmus were submitted to pathology for review.  The neck was irrigated with warm saline. Fibrillar was placed throughout the operative field. Strap muscles were approximated in the midline with interrupted 3-0 Vicryl sutures. Platysma was closed with interrupted 3-0 Vicryl sutures. Skin was closed with a running 4-0 Monocryl subcuticular suture.  Wound was washed and dried and Dermabond was applied. The patient was awakened from anesthesia and brought to the recovery room. The patient tolerated the procedure well.   Darnell Level, MD [facility] Office: [phone]

## 2019-12-30 NOTE — Interval H&P Note (Signed)
History and Physical Interval Note:  12/30/2019 7:01 AM  Deborah Salas  has presented today for surgery, with the diagnosis of RIGHT THYROID NODULE.  The various methods of treatment have been discussed with the patient and family. After consideration of risks, benefits and other options for treatment, the patient has consented to    Procedure(s): RIGHT THYROID LOBECTOMY (Right) as a surgical intervention.    The patient's history has been reviewed, patient examined, no change in status, stable for surgery.  I have reviewed the patient's chart and labs.  Questions were answered to the patient's satisfaction.    Darnell Level, MD [facility] Office: [phone]   Darnell Level

## 2019-12-30 NOTE — Anesthesia Procedure Notes (Signed)
Procedure Name: Intubation Date/Time: 12/30/2019 7:28 AM Performed by: Montez Morita, Lonzell Dorris W, CRNA Pre-anesthesia Checklist: Patient identified, Emergency Drugs available, Suction available and Patient being monitored Patient Re-evaluated:Patient Re-evaluated prior to induction Oxygen Delivery Method: Circle system utilized Preoxygenation: Pre-oxygenation with 100% oxygen Induction Type: IV induction Ventilation: Mask ventilation without difficulty Laryngoscope Size: Miller and 2 Grade View: Grade I Tube type: Oral Tube size: 7.0 mm Number of attempts: 1 Airway Equipment and Method: Stylet and Oral airway Placement Confirmation: ETT inserted through vocal cords under direct vision,  positive ETCO2 and breath sounds checked- equal and bilateral Secured at: 23 cm Tube secured with: Tape Dental Injury: Teeth and Oropharynx as per pre-operative assessment

## 2019-12-30 NOTE — Transfer of Care (Signed)
Immediate Anesthesia Transfer of Care Note  Patient: Deborah Salas  Procedure(s) Performed: RIGHT THYROID LOBECTOMY (Right Neck)  Patient Location: PACU  Anesthesia Type:General  Level of Consciousness: awake  Airway & Oxygen Therapy: Patient Spontanous Breathing and Patient connected to face mask oxygen  Post-op Assessment: Report given to RN and Post -op Vital signs reviewed and stable  Post vital signs: Reviewed and stable  Last Vitals:  Vitals Value Taken Time  BP    Temp    Pulse 91 12/30/19 0855  Resp 16 12/30/19 0855  SpO2 97 % 12/30/19 0855  Vitals shown include unvalidated device data.  Last Pain:  Vitals:   12/30/19 0541  TempSrc:   PainSc: 0-No pain      Patients Stated Pain Goal: 4 (12/30/19 0541)  Complications: No complications documented.

## 2019-12-31 ENCOUNTER — Encounter (HOSPITAL_COMMUNITY): Payer: Self-pay | Admitting: Surgery

## 2019-12-31 DIAGNOSIS — E063 Autoimmune thyroiditis: Secondary | ICD-10-CM | POA: Diagnosis not present

## 2019-12-31 DIAGNOSIS — Z79899 Other long term (current) drug therapy: Secondary | ICD-10-CM | POA: Diagnosis not present

## 2019-12-31 DIAGNOSIS — Z7984 Long term (current) use of oral hypoglycemic drugs: Secondary | ICD-10-CM | POA: Diagnosis not present

## 2019-12-31 DIAGNOSIS — E041 Nontoxic single thyroid nodule: Secondary | ICD-10-CM | POA: Diagnosis not present

## 2019-12-31 NOTE — Discharge Summary (Signed)
Physician Discharge Summary  Patient ID: Deborah Salas MRN: [id number] DOB/AGE: 1965/07/01 54 y.o.  Admit date: 12/30/2019 Discharge date: 12/31/2019  Admission Diagnoses:  Thyroid nodule - right  Discharge Diagnoses: Same Principal Problem:   Right thyroid nodule   Discharged Condition: good  Hospital Course: Right thyroid lobectomy/ isthmusectomy 12/30/19.  Patient did well overnight.  Tolerating PO's.  Pain controlled.  No swallowing issues.   Treatments: surgery: see above  Discharge Exam: Blood pressure 127/72, pulse 75, temperature 98 F (36.7 C), temperature source Oral, resp. rate 18, height 5\' 6"  (1.676 m), weight 94.2 kg, SpO2 95 %. General appearance: alert, cooperative and no distress Cervical incision intact; minimal swelling; no evidence of hematoma  Disposition: Discharge disposition: 01-Home or Self Care       Discharge Instructions    Call MD for:  persistant nausea and vomiting   Complete by: As directed    Call MD for:  redness, tenderness, or signs of infection (pain, swelling, redness, odor or green/yellow discharge around incision site)   Complete by: As directed    Call MD for:  severe uncontrolled pain   Complete by: As directed    Call MD for:  temperature >100.4   Complete by: As directed    Diet general   Complete by: As directed    Driving Restrictions   Complete by: As directed    Do not drive while taking pain medications   Increase activity slowly   Complete by: As directed    May shower / Bathe   Complete by: As directed      Allergies as of 12/31/2019   No Known Allergies     Medication List    TAKE these medications   aspirin 81 MG tablet Take 81 mg by mouth daily.   celecoxib 200 MG capsule Commonly known as: CELEBREX Take 200 mg by mouth daily.   FIBER PO Take 1 capsule by mouth daily.   folic acid 1 MG tablet Commonly known as: FOLVITE Take 1 mg by mouth daily.   losartan 25 MG tablet Commonly known as:  COZAAR Take 25 mg by mouth daily.   metFORMIN 1000 MG tablet Commonly known as: GLUCOPHAGE Take 1,000 mg by mouth daily.   methotrexate 2.5 MG tablet Commonly known as: RHEUMATREX Take 10 mg by mouth once a week. Friday   Ozempic (1 MG/DOSE) 4 MG/3ML Sopn Generic drug: Semaglutide (1 MG/DOSE) Inject 1 mg as directed every Thursday.   simvastatin 10 MG tablet Commonly known as: ZOCOR Take 10 mg by mouth daily.   traMADol 50 MG tablet Commonly known as: ULTRAM Take 1-2 tablets (50-100 mg total) by mouth every 6 (six) hours as needed for moderate pain.   Tremfya 100 MG/ML Sosy Generic drug: Guselkumab Inject 100 mg into the skin See admin instructions. Every two months       Follow-up Information    Saturday, MD. Schedule an appointment as soon as possible for a visit in 3 weeks.   Specialty: General Surgery Why: For wound re-check Contact information: [address] [place] [phone]               Signed: [phone] 12/31/2019, 8:54 AM

## 2019-12-31 NOTE — Progress Notes (Signed)
Patient was given discharge instructions, and all questions were answered.  Patient was taken to main exit by wheelchair. 

## 2020-01-03 LAB — SURGICAL PATHOLOGY

## 2020-01-16 DIAGNOSIS — Z6832 Body mass index (BMI) 32.0-32.9, adult: Secondary | ICD-10-CM | POA: Diagnosis not present

## 2020-01-16 DIAGNOSIS — J029 Acute pharyngitis, unspecified: Secondary | ICD-10-CM | POA: Diagnosis not present

## 2020-01-30 DIAGNOSIS — M5033 Other cervical disc degeneration, cervicothoracic region: Secondary | ICD-10-CM | POA: Diagnosis not present

## 2020-01-30 DIAGNOSIS — M5413 Radiculopathy, cervicothoracic region: Secondary | ICD-10-CM | POA: Diagnosis not present

## 2020-01-30 DIAGNOSIS — M546 Pain in thoracic spine: Secondary | ICD-10-CM | POA: Diagnosis not present

## 2020-02-09 DIAGNOSIS — E041 Nontoxic single thyroid nodule: Secondary | ICD-10-CM | POA: Diagnosis not present

## 2020-02-17 DIAGNOSIS — J020 Streptococcal pharyngitis: Secondary | ICD-10-CM | POA: Diagnosis not present

## 2020-02-17 DIAGNOSIS — Z6833 Body mass index (BMI) 33.0-33.9, adult: Secondary | ICD-10-CM | POA: Diagnosis not present

## 2020-02-17 DIAGNOSIS — R070 Pain in throat: Secondary | ICD-10-CM | POA: Diagnosis not present

## 2020-02-29 DIAGNOSIS — M546 Pain in thoracic spine: Secondary | ICD-10-CM | POA: Diagnosis not present

## 2020-02-29 DIAGNOSIS — M5413 Radiculopathy, cervicothoracic region: Secondary | ICD-10-CM | POA: Diagnosis not present

## 2020-03-20 DIAGNOSIS — E782 Mixed hyperlipidemia: Secondary | ICD-10-CM | POA: Diagnosis not present

## 2020-03-20 DIAGNOSIS — E1129 Type 2 diabetes mellitus with other diabetic kidney complication: Secondary | ICD-10-CM | POA: Diagnosis not present

## 2020-03-27 DIAGNOSIS — R6889 Other general symptoms and signs: Secondary | ICD-10-CM | POA: Diagnosis not present

## 2020-03-27 DIAGNOSIS — R059 Cough, unspecified: Secondary | ICD-10-CM | POA: Diagnosis not present

## 2020-03-27 DIAGNOSIS — R1310 Dysphagia, unspecified: Secondary | ICD-10-CM | POA: Diagnosis not present

## 2020-03-27 DIAGNOSIS — R198 Other specified symptoms and signs involving the digestive system and abdomen: Secondary | ICD-10-CM | POA: Diagnosis not present

## 2020-03-28 DIAGNOSIS — Z23 Encounter for immunization: Secondary | ICD-10-CM | POA: Diagnosis not present

## 2020-03-28 DIAGNOSIS — I10 Essential (primary) hypertension: Secondary | ICD-10-CM | POA: Diagnosis not present

## 2020-03-28 DIAGNOSIS — E1129 Type 2 diabetes mellitus with other diabetic kidney complication: Secondary | ICD-10-CM | POA: Diagnosis not present

## 2020-03-28 DIAGNOSIS — E782 Mixed hyperlipidemia: Secondary | ICD-10-CM | POA: Diagnosis not present

## 2020-05-29 DIAGNOSIS — Z23 Encounter for immunization: Secondary | ICD-10-CM | POA: Diagnosis not present

## 2020-06-04 DIAGNOSIS — E119 Type 2 diabetes mellitus without complications: Secondary | ICD-10-CM | POA: Diagnosis not present

## 2020-06-13 DIAGNOSIS — M5033 Other cervical disc degeneration, cervicothoracic region: Secondary | ICD-10-CM | POA: Diagnosis not present

## 2020-06-13 DIAGNOSIS — M546 Pain in thoracic spine: Secondary | ICD-10-CM | POA: Diagnosis not present

## 2020-06-13 DIAGNOSIS — M5413 Radiculopathy, cervicothoracic region: Secondary | ICD-10-CM | POA: Diagnosis not present

## 2020-07-10 DIAGNOSIS — M79672 Pain in left foot: Secondary | ICD-10-CM | POA: Diagnosis not present

## 2020-07-10 DIAGNOSIS — Z6833 Body mass index (BMI) 33.0-33.9, adult: Secondary | ICD-10-CM | POA: Diagnosis not present

## 2020-07-11 DIAGNOSIS — Z79899 Other long term (current) drug therapy: Secondary | ICD-10-CM | POA: Diagnosis not present

## 2020-07-11 DIAGNOSIS — L400 Psoriasis vulgaris: Secondary | ICD-10-CM | POA: Diagnosis not present

## 2020-07-16 DIAGNOSIS — L400 Psoriasis vulgaris: Secondary | ICD-10-CM | POA: Diagnosis not present

## 2020-07-18 DIAGNOSIS — E782 Mixed hyperlipidemia: Secondary | ICD-10-CM | POA: Diagnosis not present

## 2020-07-18 DIAGNOSIS — E1129 Type 2 diabetes mellitus with other diabetic kidney complication: Secondary | ICD-10-CM | POA: Diagnosis not present

## 2020-07-25 DIAGNOSIS — Z6832 Body mass index (BMI) 32.0-32.9, adult: Secondary | ICD-10-CM | POA: Diagnosis not present

## 2020-07-25 DIAGNOSIS — E1159 Type 2 diabetes mellitus with other circulatory complications: Secondary | ICD-10-CM | POA: Diagnosis not present

## 2020-07-25 DIAGNOSIS — I152 Hypertension secondary to endocrine disorders: Secondary | ICD-10-CM | POA: Diagnosis not present

## 2020-07-25 DIAGNOSIS — E782 Mixed hyperlipidemia: Secondary | ICD-10-CM | POA: Diagnosis not present

## 2020-07-26 ENCOUNTER — Other Ambulatory Visit: Payer: Self-pay | Admitting: Family Medicine

## 2020-07-26 DIAGNOSIS — Z139 Encounter for screening, unspecified: Secondary | ICD-10-CM

## 2020-08-22 ENCOUNTER — Other Ambulatory Visit: Payer: Self-pay

## 2020-08-22 ENCOUNTER — Ambulatory Visit
Admission: RE | Admit: 2020-08-22 | Discharge: 2020-08-22 | Disposition: A | Discharge status: Home / Self Care | Payer: BC Managed Care – PPO | Source: Ambulatory Visit | Attending: Family Medicine | Admitting: Family Medicine

## 2020-08-22 DIAGNOSIS — Z139 Encounter for screening, unspecified: Secondary | ICD-10-CM

## 2020-08-22 DIAGNOSIS — Z1231 Encounter for screening mammogram for malignant neoplasm of breast: Secondary | ICD-10-CM | POA: Diagnosis not present

## 2020-09-12 DIAGNOSIS — M7672 Peroneal tendinitis, left leg: Secondary | ICD-10-CM | POA: Diagnosis not present

## 2020-09-12 DIAGNOSIS — E119 Type 2 diabetes mellitus without complications: Secondary | ICD-10-CM | POA: Diagnosis not present

## 2020-10-02 DIAGNOSIS — Z23 Encounter for immunization: Secondary | ICD-10-CM | POA: Diagnosis not present

## 2020-10-05 DIAGNOSIS — M7672 Peroneal tendinitis, left leg: Secondary | ICD-10-CM | POA: Diagnosis not present

## 2020-10-05 DIAGNOSIS — E119 Type 2 diabetes mellitus without complications: Secondary | ICD-10-CM | POA: Diagnosis not present

## 2020-10-25 DIAGNOSIS — R0989 Other specified symptoms and signs involving the circulatory and respiratory systems: Secondary | ICD-10-CM | POA: Diagnosis not present

## 2020-11-26 DIAGNOSIS — E1129 Type 2 diabetes mellitus with other diabetic kidney complication: Secondary | ICD-10-CM | POA: Diagnosis not present

## 2020-11-26 DIAGNOSIS — E782 Mixed hyperlipidemia: Secondary | ICD-10-CM | POA: Diagnosis not present

## 2020-12-03 DIAGNOSIS — E1129 Type 2 diabetes mellitus with other diabetic kidney complication: Secondary | ICD-10-CM | POA: Diagnosis not present

## 2020-12-03 DIAGNOSIS — M5413 Radiculopathy, cervicothoracic region: Secondary | ICD-10-CM | POA: Diagnosis not present

## 2020-12-03 DIAGNOSIS — Z6833 Body mass index (BMI) 33.0-33.9, adult: Secondary | ICD-10-CM | POA: Diagnosis not present

## 2020-12-03 DIAGNOSIS — M5033 Other cervical disc degeneration, cervicothoracic region: Secondary | ICD-10-CM | POA: Diagnosis not present

## 2020-12-03 DIAGNOSIS — E1159 Type 2 diabetes mellitus with other circulatory complications: Secondary | ICD-10-CM | POA: Diagnosis not present

## 2020-12-03 DIAGNOSIS — I152 Hypertension secondary to endocrine disorders: Secondary | ICD-10-CM | POA: Diagnosis not present

## 2021-01-16 DIAGNOSIS — L400 Psoriasis vulgaris: Secondary | ICD-10-CM | POA: Diagnosis not present

## 2021-01-16 DIAGNOSIS — Z79899 Other long term (current) drug therapy: Secondary | ICD-10-CM | POA: Diagnosis not present

## 2021-01-31 DIAGNOSIS — L400 Psoriasis vulgaris: Secondary | ICD-10-CM | POA: Diagnosis not present

## 2021-01-31 DIAGNOSIS — Z79899 Other long term (current) drug therapy: Secondary | ICD-10-CM | POA: Diagnosis not present

## 2021-02-15 DIAGNOSIS — L400 Psoriasis vulgaris: Secondary | ICD-10-CM | POA: Diagnosis not present

## 2021-02-15 DIAGNOSIS — Z79899 Other long term (current) drug therapy: Secondary | ICD-10-CM | POA: Diagnosis not present

## 2021-02-18 DIAGNOSIS — Z20822 Contact with and (suspected) exposure to covid-19: Secondary | ICD-10-CM | POA: Diagnosis not present

## 2021-02-18 DIAGNOSIS — U071 COVID-19: Secondary | ICD-10-CM | POA: Diagnosis not present

## 2021-02-18 DIAGNOSIS — R059 Cough, unspecified: Secondary | ICD-10-CM | POA: Diagnosis not present

## 2021-03-04 DIAGNOSIS — M7712 Lateral epicondylitis, left elbow: Secondary | ICD-10-CM | POA: Diagnosis not present

## 2021-03-04 DIAGNOSIS — R748 Abnormal levels of other serum enzymes: Secondary | ICD-10-CM | POA: Diagnosis not present

## 2021-03-04 DIAGNOSIS — Z6832 Body mass index (BMI) 32.0-32.9, adult: Secondary | ICD-10-CM | POA: Diagnosis not present

## 2021-04-02 DIAGNOSIS — E1129 Type 2 diabetes mellitus with other diabetic kidney complication: Secondary | ICD-10-CM | POA: Diagnosis not present

## 2021-04-02 DIAGNOSIS — E782 Mixed hyperlipidemia: Secondary | ICD-10-CM | POA: Diagnosis not present

## 2021-04-08 DIAGNOSIS — E782 Mixed hyperlipidemia: Secondary | ICD-10-CM | POA: Diagnosis not present

## 2021-04-08 DIAGNOSIS — I152 Hypertension secondary to endocrine disorders: Secondary | ICD-10-CM | POA: Diagnosis not present

## 2021-04-08 DIAGNOSIS — E1159 Type 2 diabetes mellitus with other circulatory complications: Secondary | ICD-10-CM | POA: Diagnosis not present

## 2021-04-08 DIAGNOSIS — R7989 Other specified abnormal findings of blood chemistry: Secondary | ICD-10-CM | POA: Diagnosis not present

## 2021-06-03 DIAGNOSIS — M5033 Other cervical disc degeneration, cervicothoracic region: Secondary | ICD-10-CM | POA: Diagnosis not present

## 2021-06-03 DIAGNOSIS — M5413 Radiculopathy, cervicothoracic region: Secondary | ICD-10-CM | POA: Diagnosis not present

## 2021-06-17 DIAGNOSIS — J020 Streptococcal pharyngitis: Secondary | ICD-10-CM | POA: Diagnosis not present

## 2021-06-17 DIAGNOSIS — Z20822 Contact with and (suspected) exposure to covid-19: Secondary | ICD-10-CM | POA: Diagnosis not present

## 2021-06-17 DIAGNOSIS — J029 Acute pharyngitis, unspecified: Secondary | ICD-10-CM | POA: Diagnosis not present

## 2021-06-19 DIAGNOSIS — M5413 Radiculopathy, cervicothoracic region: Secondary | ICD-10-CM | POA: Diagnosis not present

## 2021-06-19 DIAGNOSIS — M5033 Other cervical disc degeneration, cervicothoracic region: Secondary | ICD-10-CM | POA: Diagnosis not present

## 2021-06-21 DIAGNOSIS — M5413 Radiculopathy, cervicothoracic region: Secondary | ICD-10-CM | POA: Diagnosis not present

## 2021-06-21 DIAGNOSIS — M5033 Other cervical disc degeneration, cervicothoracic region: Secondary | ICD-10-CM | POA: Diagnosis not present

## 2021-07-17 DIAGNOSIS — L578 Other skin changes due to chronic exposure to nonionizing radiation: Secondary | ICD-10-CM | POA: Diagnosis not present

## 2021-07-17 DIAGNOSIS — Z79899 Other long term (current) drug therapy: Secondary | ICD-10-CM | POA: Diagnosis not present

## 2021-07-17 DIAGNOSIS — L400 Psoriasis vulgaris: Secondary | ICD-10-CM | POA: Diagnosis not present

## 2021-07-17 DIAGNOSIS — L409 Psoriasis, unspecified: Secondary | ICD-10-CM | POA: Diagnosis not present

## 2021-07-17 DIAGNOSIS — L818 Other specified disorders of pigmentation: Secondary | ICD-10-CM | POA: Diagnosis not present

## 2021-07-25 DIAGNOSIS — L409 Psoriasis, unspecified: Secondary | ICD-10-CM | POA: Diagnosis not present

## 2021-07-25 DIAGNOSIS — Z79899 Other long term (current) drug therapy: Secondary | ICD-10-CM | POA: Diagnosis not present

## 2021-08-05 DIAGNOSIS — Z9889 Other specified postprocedural states: Secondary | ICD-10-CM | POA: Diagnosis not present

## 2021-08-05 DIAGNOSIS — E782 Mixed hyperlipidemia: Secondary | ICD-10-CM | POA: Diagnosis not present

## 2021-08-05 DIAGNOSIS — E1129 Type 2 diabetes mellitus with other diabetic kidney complication: Secondary | ICD-10-CM | POA: Diagnosis not present

## 2021-08-16 ENCOUNTER — Other Ambulatory Visit: Payer: Self-pay | Admitting: Family Medicine

## 2021-08-16 DIAGNOSIS — W57XXXA Bitten or stung by nonvenomous insect and other nonvenomous arthropods, initial encounter: Secondary | ICD-10-CM | POA: Diagnosis not present

## 2021-08-16 DIAGNOSIS — S80861A Insect bite (nonvenomous), right lower leg, initial encounter: Secondary | ICD-10-CM | POA: Diagnosis not present

## 2021-08-16 DIAGNOSIS — R2241 Localized swelling, mass and lump, right lower limb: Secondary | ICD-10-CM | POA: Diagnosis not present

## 2021-08-16 DIAGNOSIS — Z6833 Body mass index (BMI) 33.0-33.9, adult: Secondary | ICD-10-CM | POA: Diagnosis not present

## 2021-08-16 DIAGNOSIS — Z1231 Encounter for screening mammogram for malignant neoplasm of breast: Secondary | ICD-10-CM

## 2021-08-20 ENCOUNTER — Other Ambulatory Visit: Payer: Self-pay

## 2021-08-20 ENCOUNTER — Emergency Department (HOSPITAL_BASED_OUTPATIENT_CLINIC_OR_DEPARTMENT_OTHER)
Admission: EM | Admit: 2021-08-20 | Discharge: 2021-08-20 | Disposition: A | Discharge status: Home / Self Care | Payer: BC Managed Care – PPO | Attending: Emergency Medicine | Admitting: Emergency Medicine

## 2021-08-20 ENCOUNTER — Encounter (HOSPITAL_BASED_OUTPATIENT_CLINIC_OR_DEPARTMENT_OTHER): Payer: Self-pay | Admitting: Emergency Medicine

## 2021-08-20 DIAGNOSIS — W57XXXA Bitten or stung by nonvenomous insect and other nonvenomous arthropods, initial encounter: Secondary | ICD-10-CM | POA: Insufficient documentation

## 2021-08-20 DIAGNOSIS — S80862A Insect bite (nonvenomous), left lower leg, initial encounter: Secondary | ICD-10-CM | POA: Diagnosis not present

## 2021-08-20 DIAGNOSIS — Z7982 Long term (current) use of aspirin: Secondary | ICD-10-CM | POA: Insufficient documentation

## 2021-08-20 DIAGNOSIS — Z79899 Other long term (current) drug therapy: Secondary | ICD-10-CM | POA: Insufficient documentation

## 2021-08-20 DIAGNOSIS — S80861A Insect bite (nonvenomous), right lower leg, initial encounter: Secondary | ICD-10-CM | POA: Insufficient documentation

## 2021-08-20 MED ORDER — CEPHALEXIN 500 MG PO CAPS: 500.0000 mg | ORAL_CAPSULE | Freq: Three times a day (TID) | ORAL | 0 refills | Status: AC

## 2021-08-20 NOTE — ED Provider Notes (Signed)
[facility] EMERGENCY DEPARTMENT Provider Note   CSN: [id number] Arrival date & time: 08/20/21  1754     History  Chief Complaint  Patient presents with   Insect Bite   Leg Swelling    RL    Deborah Salas is a 56 y.o. female.  Patient states that about a week ago she was out and about and water, and thinks a bug bit her on the right inner ankle.  She noted a small welt there that increased in size.  She feels like it has not fully gone away.  She saw her primary care doctor who gave her Atarax but states that has not helped.  Otherwise denies any fevers or cough no vomiting or diarrhea.  She is diabetic and her blood sugars have been normal today per patient.  Denies fevers or chills.       Home Medications Prior to Admission medications   Medication Sig Start Date End Date Taking? Authorizing Provider  cephALEXin (KEFLEX) 500 MG capsule Take 1 capsule (500 mg total) by mouth 3 (three) times daily for 5 days. 08/20/21 08/25/21 Yes Cheryll Cockayne, MD  aspirin 81 MG tablet Take 81 mg by mouth daily.    [provider]  celecoxib (CELEBREX) 200 MG capsule Take 200 mg by mouth daily.  09/21/19   [provider]  FIBER PO Take 1 capsule by mouth daily.    [provider]  folic acid (FOLVITE) 1 MG tablet Take 1 mg by mouth daily. 11/06/19   [provider]  losartan (COZAAR) 25 MG tablet Take 25 mg by mouth daily. 09/27/19   [provider]  metFORMIN (GLUCOPHAGE) 1000 MG tablet Take 1,000 mg by mouth daily.     [provider]  methotrexate (RHEUMATREX) 2.5 MG tablet Take 10 mg by mouth once a week. Friday 11/14/19   [provider]  OZEMPIC, 1 MG/DOSE, 4 MG/3ML SOPN Inject 1 mg as directed every Thursday. 12/07/19   [provider]  simvastatin (ZOCOR) 10 MG tablet Take 10 mg by mouth daily.     [provider]  traMADol (ULTRAM) 50 MG tablet Take 1-2 tablets (50-100 mg total) by mouth every 6  (six) hours as needed for moderate pain. 12/30/19   Darnell Level, MD  TREMFYA 100 MG/ML SOSY Inject 100 mg into the skin See admin instructions. Every two months 11/30/19   [provider]      Allergies    Patient has no known allergies.    Review of Systems   Review of Systems  Constitutional:  Negative for fever.  HENT:  Negative for ear pain.   Eyes:  Negative for pain.  Respiratory:  Negative for cough.   Cardiovascular:  Negative for chest pain.  Gastrointestinal:  Negative for abdominal pain.  Genitourinary:  Negative for flank pain.  Musculoskeletal:  Negative for back pain.  Neurological:  Negative for headaches.    Physical Exam Updated Vital Signs BP 126/65 (BP Location: Left Arm)   Pulse 85   Temp 98.6 F (37 C) (Oral)   Resp 18   Ht 5\' 7"  (1.702 m)   Wt 91.2 kg   SpO2 99%   BMI 31.48 kg/m  Physical Exam Constitutional:      General: She is not in acute distress.    Appearance: Normal appearance.  HENT:     Head: Normocephalic.     Nose: Nose normal.  Eyes:     Extraocular Movements: Extraocular  movements intact.  Cardiovascular:     Rate and Rhythm: Normal rate.  Pulmonary:     Effort: Pulmonary effort is normal.  Musculoskeletal:        General: Normal range of motion.     Cervical back: Normal range of motion.  Skin:    Comments: Right lower extremity in her ankle there is a small region of induration.  No abnormal warmth no cellulitis present.  Some skin discolorations noted appears more consistent with induration as opposed to active cellulitis.  Neurological:     General: No focal deficit present.     Mental Status: She is alert. Mental status is at baseline.     ED Results / Procedures / Treatments   Labs (all labs ordered are listed, but only abnormal results are displayed) Labs Reviewed - No data to display  EKG None  Radiology No results found.  Procedures Procedures    Medications Ordered in ED Medications - No  data to display  ED Course/ Medical Decision Making/ A&P                           Medical Decision Making  Review of records shows surgery December 30, 2019 for the right thyroid lobectomy.  History from family at bedside.  On clinical exam I doubt cellulitis.  This appears to be more of a residual induration from prior lesion.  Patient concerned due to her diabetic status.  Will prescribe a course of antibiotics and advised continued management and follow-up with her primary care doctor within the week.  Advised return for increased redness increased welling pain fevers chills or any additional concerns.        Final Clinical Impression(s) / ED Diagnoses Final diagnoses:  Insect bite, unspecified site, initial encounter    Rx / DC Orders ED Discharge Orders          Ordered    cephALEXin (KEFLEX) 500 MG capsule  3 times daily        08/20/21 2112              Cheryll Cockayne, MD 08/20/21 2113

## 2021-08-20 NOTE — ED Triage Notes (Signed)
Pt reports insect bite to right medial calf that occurred on 7/4. Seen by PCP, was prescribed Atarax but has no found relief. Area has worsened, is red and swollen. Hs of DM.

## 2021-08-20 NOTE — ED Notes (Signed)
Rx x 1 given  Written and verbal inst to pt  Verbalized an understanding  To home with family  

## 2021-08-20 NOTE — Discharge Instructions (Addendum)
Call your primary care doctor or specialist as discussed in the next 2-3 days.   Return immediately back to the ER if:  Your symptoms worsen within the next 12-24 hours. You develop new symptoms such as new fevers, persistent vomiting, new pain, shortness of breath, or new weakness or numbness, or if you have any other concerns.  

## 2021-08-23 DIAGNOSIS — Z1331 Encounter for screening for depression: Secondary | ICD-10-CM | POA: Diagnosis not present

## 2021-08-23 DIAGNOSIS — E1129 Type 2 diabetes mellitus with other diabetic kidney complication: Secondary | ICD-10-CM | POA: Diagnosis not present

## 2021-08-23 DIAGNOSIS — Z6832 Body mass index (BMI) 32.0-32.9, adult: Secondary | ICD-10-CM | POA: Diagnosis not present

## 2021-08-23 DIAGNOSIS — I10 Essential (primary) hypertension: Secondary | ICD-10-CM | POA: Diagnosis not present

## 2021-08-23 DIAGNOSIS — E782 Mixed hyperlipidemia: Secondary | ICD-10-CM | POA: Diagnosis not present

## 2021-09-26 DIAGNOSIS — M5033 Other cervical disc degeneration, cervicothoracic region: Secondary | ICD-10-CM | POA: Diagnosis not present

## 2021-09-26 DIAGNOSIS — M5442 Lumbago with sciatica, left side: Secondary | ICD-10-CM | POA: Diagnosis not present

## 2021-09-26 DIAGNOSIS — M5413 Radiculopathy, cervicothoracic region: Secondary | ICD-10-CM | POA: Diagnosis not present

## 2021-09-26 DIAGNOSIS — M546 Pain in thoracic spine: Secondary | ICD-10-CM | POA: Diagnosis not present

## 2021-09-27 IMAGING — CR DG CHEST 2V
2 series · 2 of 2 positions shown · non-contrast
Comparison: 10/27/2014.

CLINICAL DATA: Preop.

EXAM:
CHEST - 2 VIEW

[w chest pa]
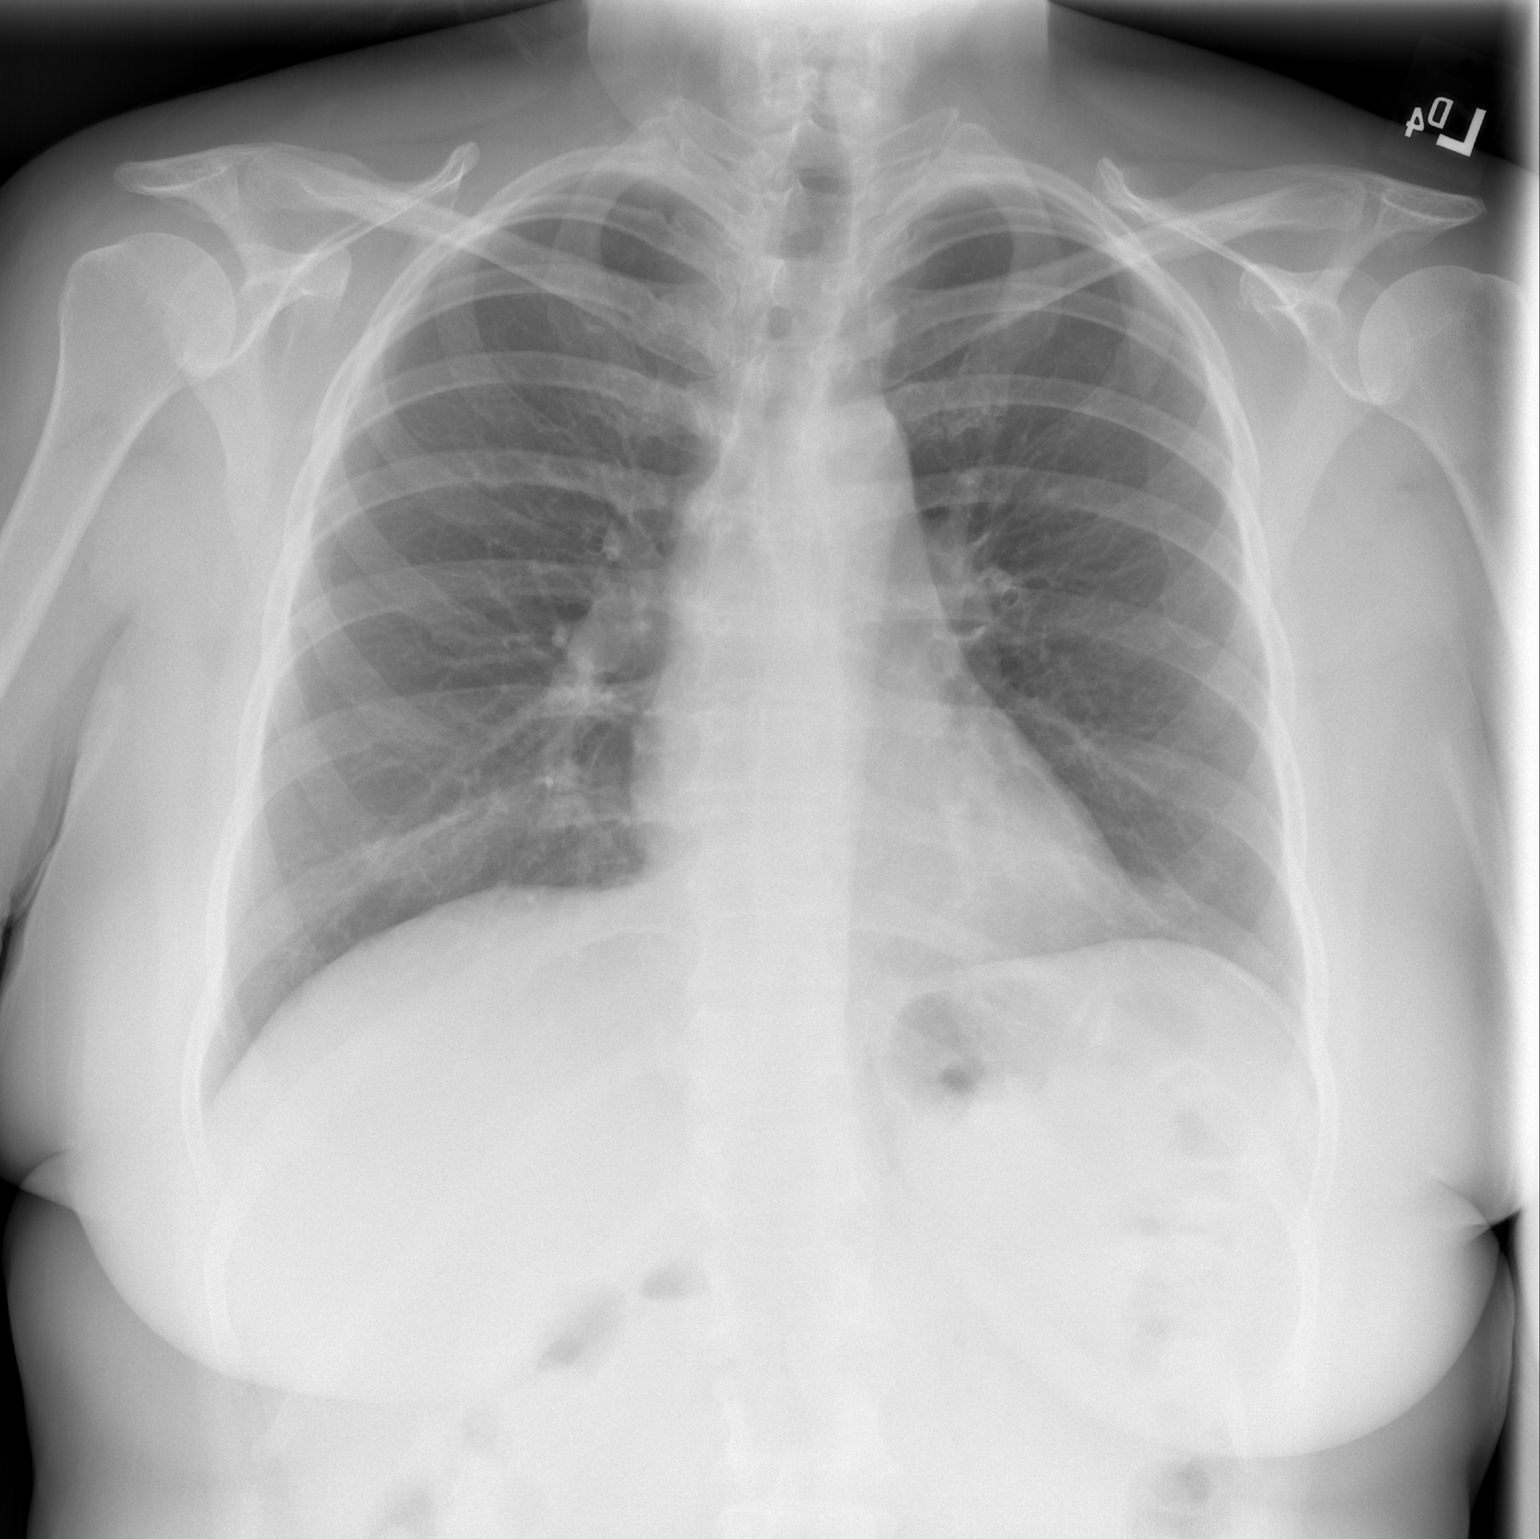

[w chest lat]
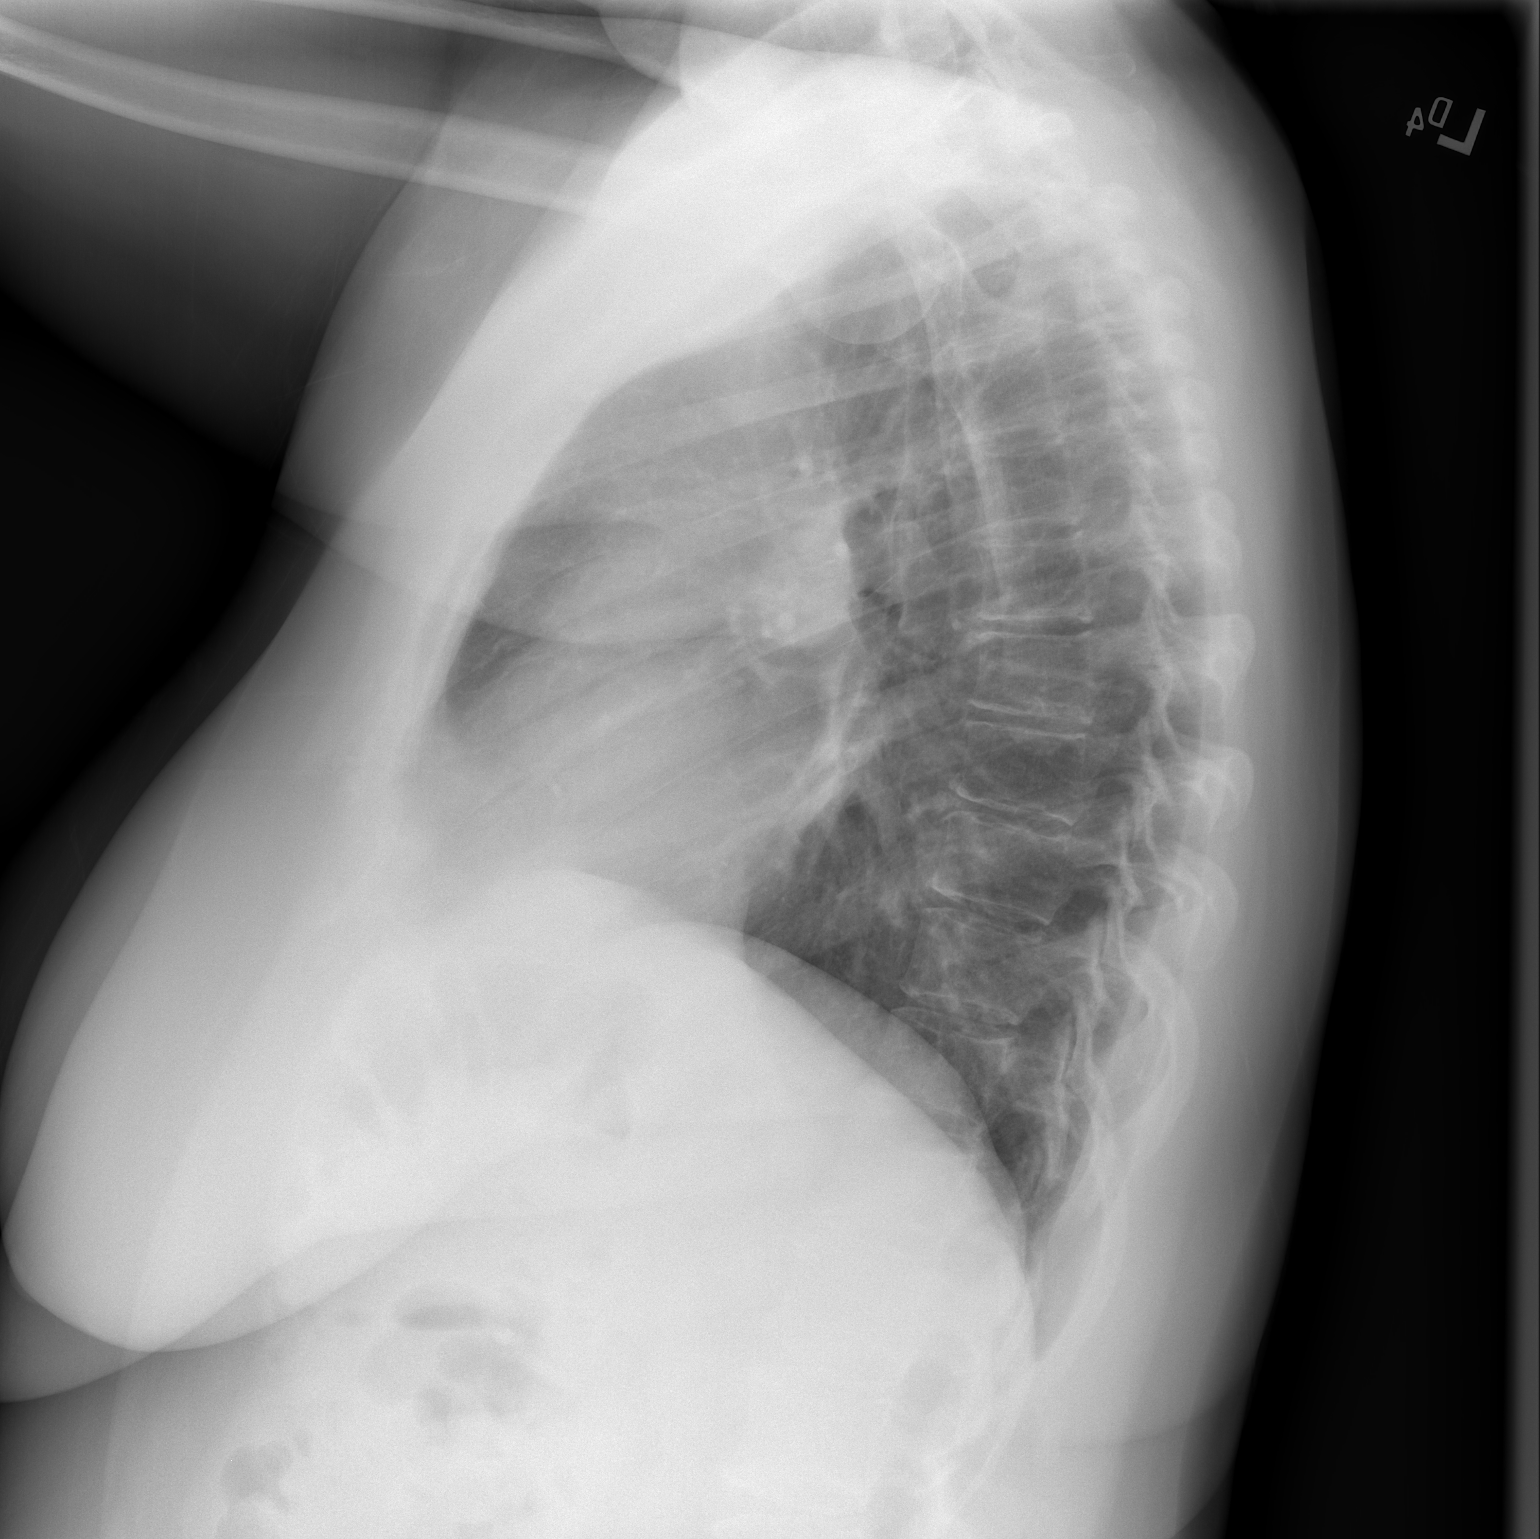

[2 of 2 positions shown; findings below may reference images not displayed]

FINDINGS: The heart size and mediastinal contours are within normal limits.
Both lungs are clear. No visible pleural effusions or pneumothorax.
The visualized skeletal structures are unremarkable.
IMPRESSION: No active cardiopulmonary disease.

## 2021-09-30 ENCOUNTER — Ambulatory Visit
Admission: RE | Admit: 2021-09-30 | Discharge: 2021-09-30 | Disposition: A | Discharge status: Home / Self Care | Payer: BC Managed Care – PPO | Source: Ambulatory Visit | Attending: Family Medicine | Admitting: Family Medicine

## 2021-09-30 DIAGNOSIS — Z1231 Encounter for screening mammogram for malignant neoplasm of breast: Secondary | ICD-10-CM

## 2021-10-21 DIAGNOSIS — M1711 Unilateral primary osteoarthritis, right knee: Secondary | ICD-10-CM | POA: Diagnosis not present

## 2021-12-16 DIAGNOSIS — M1711 Unilateral primary osteoarthritis, right knee: Secondary | ICD-10-CM | POA: Diagnosis not present

## 2021-12-19 DIAGNOSIS — E782 Mixed hyperlipidemia: Secondary | ICD-10-CM | POA: Diagnosis not present

## 2021-12-19 DIAGNOSIS — E1159 Type 2 diabetes mellitus with other circulatory complications: Secondary | ICD-10-CM | POA: Diagnosis not present

## 2021-12-19 DIAGNOSIS — E1129 Type 2 diabetes mellitus with other diabetic kidney complication: Secondary | ICD-10-CM | POA: Diagnosis not present

## 2021-12-23 DIAGNOSIS — M1711 Unilateral primary osteoarthritis, right knee: Secondary | ICD-10-CM | POA: Diagnosis not present

## 2021-12-27 DIAGNOSIS — E1129 Type 2 diabetes mellitus with other diabetic kidney complication: Secondary | ICD-10-CM | POA: Diagnosis not present

## 2021-12-27 DIAGNOSIS — I10 Essential (primary) hypertension: Secondary | ICD-10-CM | POA: Diagnosis not present

## 2021-12-27 DIAGNOSIS — Z6832 Body mass index (BMI) 32.0-32.9, adult: Secondary | ICD-10-CM | POA: Diagnosis not present

## 2021-12-27 DIAGNOSIS — E782 Mixed hyperlipidemia: Secondary | ICD-10-CM | POA: Diagnosis not present

## 2021-12-30 DIAGNOSIS — M1711 Unilateral primary osteoarthritis, right knee: Secondary | ICD-10-CM | POA: Diagnosis not present

## 2022-01-13 DIAGNOSIS — L820 Inflamed seborrheic keratosis: Secondary | ICD-10-CM | POA: Diagnosis not present

## 2022-01-13 DIAGNOSIS — L400 Psoriasis vulgaris: Secondary | ICD-10-CM | POA: Diagnosis not present

## 2022-01-13 DIAGNOSIS — E669 Obesity, unspecified: Secondary | ICD-10-CM | POA: Diagnosis not present

## 2022-01-13 DIAGNOSIS — D485 Neoplasm of uncertain behavior of skin: Secondary | ICD-10-CM | POA: Diagnosis not present

## 2022-01-13 DIAGNOSIS — Z79899 Other long term (current) drug therapy: Secondary | ICD-10-CM | POA: Diagnosis not present

## 2022-02-06 DIAGNOSIS — M546 Pain in thoracic spine: Secondary | ICD-10-CM | POA: Diagnosis not present

## 2022-02-06 DIAGNOSIS — M5033 Other cervical disc degeneration, cervicothoracic region: Secondary | ICD-10-CM | POA: Diagnosis not present

## 2022-02-06 DIAGNOSIS — M5442 Lumbago with sciatica, left side: Secondary | ICD-10-CM | POA: Diagnosis not present

## 2022-02-06 DIAGNOSIS — M5413 Radiculopathy, cervicothoracic region: Secondary | ICD-10-CM | POA: Diagnosis not present

## 2022-02-06 DIAGNOSIS — L811 Chloasma: Secondary | ICD-10-CM | POA: Diagnosis not present

## 2022-04-23 DIAGNOSIS — E1159 Type 2 diabetes mellitus with other circulatory complications: Secondary | ICD-10-CM | POA: Diagnosis not present

## 2022-04-23 DIAGNOSIS — E782 Mixed hyperlipidemia: Secondary | ICD-10-CM | POA: Diagnosis not present

## 2022-04-23 DIAGNOSIS — E1129 Type 2 diabetes mellitus with other diabetic kidney complication: Secondary | ICD-10-CM | POA: Diagnosis not present

## 2022-04-28 DIAGNOSIS — E1129 Type 2 diabetes mellitus with other diabetic kidney complication: Secondary | ICD-10-CM | POA: Diagnosis not present

## 2022-04-28 DIAGNOSIS — E782 Mixed hyperlipidemia: Secondary | ICD-10-CM | POA: Diagnosis not present

## 2022-04-28 DIAGNOSIS — E1159 Type 2 diabetes mellitus with other circulatory complications: Secondary | ICD-10-CM | POA: Diagnosis not present

## 2022-04-28 DIAGNOSIS — Z6832 Body mass index (BMI) 32.0-32.9, adult: Secondary | ICD-10-CM | POA: Diagnosis not present

## 2022-04-28 DIAGNOSIS — I152 Hypertension secondary to endocrine disorders: Secondary | ICD-10-CM | POA: Diagnosis not present

## 2022-05-28 DIAGNOSIS — L811 Chloasma: Secondary | ICD-10-CM | POA: Diagnosis not present

## 2022-06-04 DIAGNOSIS — M5442 Lumbago with sciatica, left side: Secondary | ICD-10-CM | POA: Diagnosis not present

## 2022-06-04 DIAGNOSIS — M546 Pain in thoracic spine: Secondary | ICD-10-CM | POA: Diagnosis not present

## 2022-06-04 DIAGNOSIS — M5033 Other cervical disc degeneration, cervicothoracic region: Secondary | ICD-10-CM | POA: Diagnosis not present

## 2022-06-04 DIAGNOSIS — M5413 Radiculopathy, cervicothoracic region: Secondary | ICD-10-CM | POA: Diagnosis not present

## 2022-06-18 DIAGNOSIS — M5033 Other cervical disc degeneration, cervicothoracic region: Secondary | ICD-10-CM | POA: Diagnosis not present

## 2022-06-18 DIAGNOSIS — M546 Pain in thoracic spine: Secondary | ICD-10-CM | POA: Diagnosis not present

## 2022-06-18 DIAGNOSIS — M5413 Radiculopathy, cervicothoracic region: Secondary | ICD-10-CM | POA: Diagnosis not present

## 2022-06-18 DIAGNOSIS — M5442 Lumbago with sciatica, left side: Secondary | ICD-10-CM | POA: Diagnosis not present

## 2022-06-30 DIAGNOSIS — E119 Type 2 diabetes mellitus without complications: Secondary | ICD-10-CM | POA: Diagnosis not present

## 2022-07-14 DIAGNOSIS — L400 Psoriasis vulgaris: Secondary | ICD-10-CM | POA: Diagnosis not present

## 2022-07-14 DIAGNOSIS — Z79899 Other long term (current) drug therapy: Secondary | ICD-10-CM | POA: Diagnosis not present

## 2022-07-18 DIAGNOSIS — M546 Pain in thoracic spine: Secondary | ICD-10-CM | POA: Diagnosis not present

## 2022-07-18 DIAGNOSIS — M5442 Lumbago with sciatica, left side: Secondary | ICD-10-CM | POA: Diagnosis not present

## 2022-07-18 DIAGNOSIS — M5033 Other cervical disc degeneration, cervicothoracic region: Secondary | ICD-10-CM | POA: Diagnosis not present

## 2022-07-18 DIAGNOSIS — M5413 Radiculopathy, cervicothoracic region: Secondary | ICD-10-CM | POA: Diagnosis not present

## 2022-08-26 DIAGNOSIS — E782 Mixed hyperlipidemia: Secondary | ICD-10-CM | POA: Diagnosis not present

## 2022-08-26 DIAGNOSIS — E1129 Type 2 diabetes mellitus with other diabetic kidney complication: Secondary | ICD-10-CM | POA: Diagnosis not present

## 2022-08-26 DIAGNOSIS — E1159 Type 2 diabetes mellitus with other circulatory complications: Secondary | ICD-10-CM | POA: Diagnosis not present

## 2022-08-29 DIAGNOSIS — Z1331 Encounter for screening for depression: Secondary | ICD-10-CM | POA: Diagnosis not present

## 2022-08-29 DIAGNOSIS — E782 Mixed hyperlipidemia: Secondary | ICD-10-CM | POA: Diagnosis not present

## 2022-08-29 DIAGNOSIS — I10 Essential (primary) hypertension: Secondary | ICD-10-CM | POA: Diagnosis not present

## 2022-08-29 DIAGNOSIS — E1159 Type 2 diabetes mellitus with other circulatory complications: Secondary | ICD-10-CM | POA: Diagnosis not present

## 2022-08-29 DIAGNOSIS — I152 Hypertension secondary to endocrine disorders: Secondary | ICD-10-CM | POA: Diagnosis not present

## 2022-08-29 DIAGNOSIS — Z6833 Body mass index (BMI) 33.0-33.9, adult: Secondary | ICD-10-CM | POA: Diagnosis not present

## 2022-10-09 ENCOUNTER — Other Ambulatory Visit: Payer: Self-pay | Admitting: Family Medicine

## 2022-10-09 DIAGNOSIS — Z1231 Encounter for screening mammogram for malignant neoplasm of breast: Secondary | ICD-10-CM

## 2022-10-24 DIAGNOSIS — L4050 Arthropathic psoriasis, unspecified: Secondary | ICD-10-CM | POA: Diagnosis not present

## 2022-10-24 DIAGNOSIS — Z6832 Body mass index (BMI) 32.0-32.9, adult: Secondary | ICD-10-CM | POA: Diagnosis not present

## 2022-10-24 DIAGNOSIS — Z23 Encounter for immunization: Secondary | ICD-10-CM | POA: Diagnosis not present

## 2022-10-29 ENCOUNTER — Ambulatory Visit
Admission: RE | Admit: 2022-10-29 | Discharge: 2022-10-29 | Disposition: A | Discharge status: Home / Self Care | Payer: BC Managed Care – PPO | Source: Ambulatory Visit | Attending: Family Medicine | Admitting: Family Medicine

## 2022-10-29 DIAGNOSIS — Z1231 Encounter for screening mammogram for malignant neoplasm of breast: Secondary | ICD-10-CM | POA: Diagnosis not present

## 2022-10-30 DIAGNOSIS — M546 Pain in thoracic spine: Secondary | ICD-10-CM | POA: Diagnosis not present

## 2022-10-30 DIAGNOSIS — M5442 Lumbago with sciatica, left side: Secondary | ICD-10-CM | POA: Diagnosis not present

## 2022-10-30 DIAGNOSIS — M5413 Radiculopathy, cervicothoracic region: Secondary | ICD-10-CM | POA: Diagnosis not present

## 2022-10-30 DIAGNOSIS — M5033 Other cervical disc degeneration, cervicothoracic region: Secondary | ICD-10-CM | POA: Diagnosis not present

## 2022-12-03 DIAGNOSIS — L811 Chloasma: Secondary | ICD-10-CM | POA: Diagnosis not present

## 2022-12-03 DIAGNOSIS — L400 Psoriasis vulgaris: Secondary | ICD-10-CM | POA: Diagnosis not present

## 2022-12-10 DIAGNOSIS — Z79899 Other long term (current) drug therapy: Secondary | ICD-10-CM | POA: Diagnosis not present

## 2022-12-10 DIAGNOSIS — M1990 Unspecified osteoarthritis, unspecified site: Secondary | ICD-10-CM | POA: Diagnosis not present

## 2022-12-10 DIAGNOSIS — M25562 Pain in left knee: Secondary | ICD-10-CM | POA: Diagnosis not present

## 2022-12-10 DIAGNOSIS — M2550 Pain in unspecified joint: Secondary | ICD-10-CM | POA: Diagnosis not present

## 2022-12-10 DIAGNOSIS — M79671 Pain in right foot: Secondary | ICD-10-CM | POA: Diagnosis not present

## 2022-12-10 DIAGNOSIS — M79641 Pain in right hand: Secondary | ICD-10-CM | POA: Diagnosis not present

## 2022-12-10 DIAGNOSIS — M25561 Pain in right knee: Secondary | ICD-10-CM | POA: Diagnosis not present

## 2022-12-10 DIAGNOSIS — M79642 Pain in left hand: Secondary | ICD-10-CM | POA: Diagnosis not present

## 2022-12-10 DIAGNOSIS — M79643 Pain in unspecified hand: Secondary | ICD-10-CM | POA: Diagnosis not present

## 2022-12-10 DIAGNOSIS — L4050 Arthropathic psoriasis, unspecified: Secondary | ICD-10-CM | POA: Diagnosis not present

## 2022-12-10 DIAGNOSIS — M79672 Pain in left foot: Secondary | ICD-10-CM | POA: Diagnosis not present

## 2022-12-10 DIAGNOSIS — M549 Dorsalgia, unspecified: Secondary | ICD-10-CM | POA: Diagnosis not present

## 2022-12-15 DIAGNOSIS — M546 Pain in thoracic spine: Secondary | ICD-10-CM | POA: Diagnosis not present

## 2022-12-15 DIAGNOSIS — M5442 Lumbago with sciatica, left side: Secondary | ICD-10-CM | POA: Diagnosis not present

## 2022-12-15 DIAGNOSIS — M5413 Radiculopathy, cervicothoracic region: Secondary | ICD-10-CM | POA: Diagnosis not present

## 2022-12-15 DIAGNOSIS — M5033 Other cervical disc degeneration, cervicothoracic region: Secondary | ICD-10-CM | POA: Diagnosis not present

## 2022-12-17 DIAGNOSIS — M5033 Other cervical disc degeneration, cervicothoracic region: Secondary | ICD-10-CM | POA: Diagnosis not present

## 2022-12-17 DIAGNOSIS — M5442 Lumbago with sciatica, left side: Secondary | ICD-10-CM | POA: Diagnosis not present

## 2022-12-17 DIAGNOSIS — M5413 Radiculopathy, cervicothoracic region: Secondary | ICD-10-CM | POA: Diagnosis not present

## 2022-12-17 DIAGNOSIS — M546 Pain in thoracic spine: Secondary | ICD-10-CM | POA: Diagnosis not present

## 2022-12-19 DIAGNOSIS — M546 Pain in thoracic spine: Secondary | ICD-10-CM | POA: Diagnosis not present

## 2022-12-19 DIAGNOSIS — M5033 Other cervical disc degeneration, cervicothoracic region: Secondary | ICD-10-CM | POA: Diagnosis not present

## 2022-12-19 DIAGNOSIS — M5442 Lumbago with sciatica, left side: Secondary | ICD-10-CM | POA: Diagnosis not present

## 2022-12-19 DIAGNOSIS — M5413 Radiculopathy, cervicothoracic region: Secondary | ICD-10-CM | POA: Diagnosis not present

## 2022-12-22 DIAGNOSIS — M5413 Radiculopathy, cervicothoracic region: Secondary | ICD-10-CM | POA: Diagnosis not present

## 2022-12-22 DIAGNOSIS — M546 Pain in thoracic spine: Secondary | ICD-10-CM | POA: Diagnosis not present

## 2022-12-22 DIAGNOSIS — M5442 Lumbago with sciatica, left side: Secondary | ICD-10-CM | POA: Diagnosis not present

## 2022-12-22 DIAGNOSIS — M5033 Other cervical disc degeneration, cervicothoracic region: Secondary | ICD-10-CM | POA: Diagnosis not present

## 2022-12-31 DIAGNOSIS — E1159 Type 2 diabetes mellitus with other circulatory complications: Secondary | ICD-10-CM | POA: Diagnosis not present

## 2022-12-31 DIAGNOSIS — E1129 Type 2 diabetes mellitus with other diabetic kidney complication: Secondary | ICD-10-CM | POA: Diagnosis not present

## 2022-12-31 DIAGNOSIS — E782 Mixed hyperlipidemia: Secondary | ICD-10-CM | POA: Diagnosis not present

## 2023-01-07 DIAGNOSIS — E1159 Type 2 diabetes mellitus with other circulatory complications: Secondary | ICD-10-CM | POA: Diagnosis not present

## 2023-01-07 DIAGNOSIS — E782 Mixed hyperlipidemia: Secondary | ICD-10-CM | POA: Diagnosis not present

## 2023-01-07 DIAGNOSIS — I152 Hypertension secondary to endocrine disorders: Secondary | ICD-10-CM | POA: Diagnosis not present

## 2023-01-07 DIAGNOSIS — Z6832 Body mass index (BMI) 32.0-32.9, adult: Secondary | ICD-10-CM | POA: Diagnosis not present

## 2023-01-13 DIAGNOSIS — Z79899 Other long term (current) drug therapy: Secondary | ICD-10-CM | POA: Diagnosis not present

## 2023-01-20 DIAGNOSIS — M79643 Pain in unspecified hand: Secondary | ICD-10-CM | POA: Diagnosis not present

## 2023-01-20 DIAGNOSIS — L4050 Arthropathic psoriasis, unspecified: Secondary | ICD-10-CM | POA: Diagnosis not present

## 2023-01-20 DIAGNOSIS — M25569 Pain in unspecified knee: Secondary | ICD-10-CM | POA: Diagnosis not present

## 2023-01-20 DIAGNOSIS — M7989 Other specified soft tissue disorders: Secondary | ICD-10-CM | POA: Diagnosis not present

## 2023-02-17 DIAGNOSIS — M2550 Pain in unspecified joint: Secondary | ICD-10-CM | POA: Diagnosis not present

## 2023-03-24 DIAGNOSIS — M7989 Other specified soft tissue disorders: Secondary | ICD-10-CM | POA: Diagnosis not present

## 2023-03-24 DIAGNOSIS — L4050 Arthropathic psoriasis, unspecified: Secondary | ICD-10-CM | POA: Diagnosis not present

## 2023-03-24 DIAGNOSIS — M79643 Pain in unspecified hand: Secondary | ICD-10-CM | POA: Diagnosis not present

## 2023-03-24 DIAGNOSIS — M25569 Pain in unspecified knee: Secondary | ICD-10-CM | POA: Diagnosis not present

## 2023-03-24 DIAGNOSIS — Z13820 Encounter for screening for osteoporosis: Secondary | ICD-10-CM | POA: Diagnosis not present

## 2023-03-25 DIAGNOSIS — M51362 Other intervertebral disc degeneration, lumbar region with discogenic back pain and lower extremity pain: Secondary | ICD-10-CM | POA: Diagnosis not present

## 2023-03-25 DIAGNOSIS — M5413 Radiculopathy, cervicothoracic region: Secondary | ICD-10-CM | POA: Diagnosis not present

## 2023-03-25 DIAGNOSIS — M9901 Segmental and somatic dysfunction of cervical region: Secondary | ICD-10-CM | POA: Diagnosis not present

## 2023-03-25 DIAGNOSIS — M546 Pain in thoracic spine: Secondary | ICD-10-CM | POA: Diagnosis not present

## 2023-03-25 DIAGNOSIS — M9903 Segmental and somatic dysfunction of lumbar region: Secondary | ICD-10-CM | POA: Diagnosis not present

## 2023-03-25 DIAGNOSIS — M9902 Segmental and somatic dysfunction of thoracic region: Secondary | ICD-10-CM | POA: Diagnosis not present

## 2023-03-25 DIAGNOSIS — M5442 Lumbago with sciatica, left side: Secondary | ICD-10-CM | POA: Diagnosis not present

## 2023-03-25 DIAGNOSIS — M62830 Muscle spasm of back: Secondary | ICD-10-CM | POA: Diagnosis not present

## 2023-04-09 DIAGNOSIS — M546 Pain in thoracic spine: Secondary | ICD-10-CM | POA: Diagnosis not present

## 2023-04-09 DIAGNOSIS — M5442 Lumbago with sciatica, left side: Secondary | ICD-10-CM | POA: Diagnosis not present

## 2023-04-09 DIAGNOSIS — M5413 Radiculopathy, cervicothoracic region: Secondary | ICD-10-CM | POA: Diagnosis not present

## 2023-04-16 DIAGNOSIS — M546 Pain in thoracic spine: Secondary | ICD-10-CM | POA: Diagnosis not present

## 2023-04-16 DIAGNOSIS — M5413 Radiculopathy, cervicothoracic region: Secondary | ICD-10-CM | POA: Diagnosis not present

## 2023-04-16 DIAGNOSIS — M51362 Other intervertebral disc degeneration, lumbar region with discogenic back pain and lower extremity pain: Secondary | ICD-10-CM | POA: Diagnosis not present

## 2023-04-16 DIAGNOSIS — M5442 Lumbago with sciatica, left side: Secondary | ICD-10-CM | POA: Diagnosis not present

## 2023-04-28 DIAGNOSIS — Z6832 Body mass index (BMI) 32.0-32.9, adult: Secondary | ICD-10-CM | POA: Diagnosis not present

## 2023-04-28 DIAGNOSIS — J989 Respiratory disorder, unspecified: Secondary | ICD-10-CM | POA: Diagnosis not present

## 2023-04-28 DIAGNOSIS — Z20822 Contact with and (suspected) exposure to covid-19: Secondary | ICD-10-CM | POA: Diagnosis not present

## 2023-04-28 DIAGNOSIS — R059 Cough, unspecified: Secondary | ICD-10-CM | POA: Diagnosis not present

## 2023-05-05 DIAGNOSIS — E1129 Type 2 diabetes mellitus with other diabetic kidney complication: Secondary | ICD-10-CM | POA: Diagnosis not present

## 2023-05-05 DIAGNOSIS — E782 Mixed hyperlipidemia: Secondary | ICD-10-CM | POA: Diagnosis not present

## 2023-05-05 DIAGNOSIS — E1159 Type 2 diabetes mellitus with other circulatory complications: Secondary | ICD-10-CM | POA: Diagnosis not present

## 2023-05-11 DIAGNOSIS — I10 Essential (primary) hypertension: Secondary | ICD-10-CM | POA: Diagnosis not present

## 2023-05-11 DIAGNOSIS — E1159 Type 2 diabetes mellitus with other circulatory complications: Secondary | ICD-10-CM | POA: Diagnosis not present

## 2023-05-11 DIAGNOSIS — I152 Hypertension secondary to endocrine disorders: Secondary | ICD-10-CM | POA: Diagnosis not present

## 2023-05-11 DIAGNOSIS — Z6832 Body mass index (BMI) 32.0-32.9, adult: Secondary | ICD-10-CM | POA: Diagnosis not present

## 2023-05-11 DIAGNOSIS — E782 Mixed hyperlipidemia: Secondary | ICD-10-CM | POA: Diagnosis not present

## 2023-05-21 DIAGNOSIS — M546 Pain in thoracic spine: Secondary | ICD-10-CM | POA: Diagnosis not present

## 2023-05-21 DIAGNOSIS — M5413 Radiculopathy, cervicothoracic region: Secondary | ICD-10-CM | POA: Diagnosis not present

## 2023-05-21 DIAGNOSIS — M5442 Lumbago with sciatica, left side: Secondary | ICD-10-CM | POA: Diagnosis not present

## 2023-06-17 DIAGNOSIS — L400 Psoriasis vulgaris: Secondary | ICD-10-CM | POA: Diagnosis not present

## 2023-06-23 DIAGNOSIS — L400 Psoriasis vulgaris: Secondary | ICD-10-CM | POA: Diagnosis not present

## 2023-06-23 DIAGNOSIS — Z79899 Other long term (current) drug therapy: Secondary | ICD-10-CM | POA: Diagnosis not present

## 2023-07-13 DIAGNOSIS — L811 Chloasma: Secondary | ICD-10-CM | POA: Diagnosis not present

## 2023-07-20 DIAGNOSIS — M5413 Radiculopathy, cervicothoracic region: Secondary | ICD-10-CM | POA: Diagnosis not present

## 2023-07-20 DIAGNOSIS — M51362 Other intervertebral disc degeneration, lumbar region with discogenic back pain and lower extremity pain: Secondary | ICD-10-CM | POA: Diagnosis not present

## 2023-07-20 DIAGNOSIS — M5442 Lumbago with sciatica, left side: Secondary | ICD-10-CM | POA: Diagnosis not present

## 2023-07-20 DIAGNOSIS — M546 Pain in thoracic spine: Secondary | ICD-10-CM | POA: Diagnosis not present

## 2023-08-03 DIAGNOSIS — L4050 Arthropathic psoriasis, unspecified: Secondary | ICD-10-CM | POA: Diagnosis not present

## 2023-08-03 DIAGNOSIS — M25569 Pain in unspecified knee: Secondary | ICD-10-CM | POA: Diagnosis not present

## 2023-08-03 DIAGNOSIS — Z79899 Other long term (current) drug therapy: Secondary | ICD-10-CM | POA: Diagnosis not present

## 2023-08-03 DIAGNOSIS — M549 Dorsalgia, unspecified: Secondary | ICD-10-CM | POA: Diagnosis not present

## 2023-08-03 DIAGNOSIS — M79643 Pain in unspecified hand: Secondary | ICD-10-CM | POA: Diagnosis not present

## 2023-08-10 DIAGNOSIS — Z6832 Body mass index (BMI) 32.0-32.9, adult: Secondary | ICD-10-CM | POA: Diagnosis not present

## 2023-08-10 DIAGNOSIS — G5603 Carpal tunnel syndrome, bilateral upper limbs: Secondary | ICD-10-CM | POA: Diagnosis not present

## 2023-08-11 DIAGNOSIS — E1129 Type 2 diabetes mellitus with other diabetic kidney complication: Secondary | ICD-10-CM | POA: Diagnosis not present

## 2023-08-11 DIAGNOSIS — E1159 Type 2 diabetes mellitus with other circulatory complications: Secondary | ICD-10-CM | POA: Diagnosis not present

## 2023-08-11 DIAGNOSIS — E782 Mixed hyperlipidemia: Secondary | ICD-10-CM | POA: Diagnosis not present

## 2023-08-19 DIAGNOSIS — E119 Type 2 diabetes mellitus without complications: Secondary | ICD-10-CM | POA: Diagnosis not present

## 2023-08-24 DIAGNOSIS — I10 Essential (primary) hypertension: Secondary | ICD-10-CM | POA: Diagnosis not present

## 2023-08-24 DIAGNOSIS — E782 Mixed hyperlipidemia: Secondary | ICD-10-CM | POA: Diagnosis not present

## 2023-08-24 DIAGNOSIS — Z6832 Body mass index (BMI) 32.0-32.9, adult: Secondary | ICD-10-CM | POA: Diagnosis not present

## 2023-08-24 DIAGNOSIS — E1129 Type 2 diabetes mellitus with other diabetic kidney complication: Secondary | ICD-10-CM | POA: Diagnosis not present

## 2023-10-22 DIAGNOSIS — M546 Pain in thoracic spine: Secondary | ICD-10-CM | POA: Diagnosis not present

## 2023-10-22 DIAGNOSIS — M5442 Lumbago with sciatica, left side: Secondary | ICD-10-CM | POA: Diagnosis not present

## 2023-10-22 DIAGNOSIS — M5413 Radiculopathy, cervicothoracic region: Secondary | ICD-10-CM | POA: Diagnosis not present

## 2023-11-03 DIAGNOSIS — M549 Dorsalgia, unspecified: Secondary | ICD-10-CM | POA: Diagnosis not present

## 2023-11-03 DIAGNOSIS — L4050 Arthropathic psoriasis, unspecified: Secondary | ICD-10-CM | POA: Diagnosis not present

## 2023-11-03 DIAGNOSIS — Z23 Encounter for immunization: Secondary | ICD-10-CM | POA: Diagnosis not present

## 2023-11-03 DIAGNOSIS — Z79899 Other long term (current) drug therapy: Secondary | ICD-10-CM | POA: Diagnosis not present

## 2023-11-03 DIAGNOSIS — M25569 Pain in unspecified knee: Secondary | ICD-10-CM | POA: Diagnosis not present

## 2023-11-03 DIAGNOSIS — M79643 Pain in unspecified hand: Secondary | ICD-10-CM | POA: Diagnosis not present

## 2023-11-10 ENCOUNTER — Other Ambulatory Visit: Payer: Self-pay | Admitting: Family Medicine

## 2023-11-10 DIAGNOSIS — Z1231 Encounter for screening mammogram for malignant neoplasm of breast: Secondary | ICD-10-CM

## 2023-11-18 ENCOUNTER — Ambulatory Visit: Admission: RE | Admit: 2023-11-18 | Source: Ambulatory Visit

## 2023-11-18 ENCOUNTER — Ambulatory Visit
Admission: RE | Admit: 2023-11-18 | Discharge: 2023-11-18 | Disposition: A | Discharge status: Home / Self Care | Payer: BC Managed Care – PPO | Source: Ambulatory Visit | Attending: Family Medicine | Admitting: Family Medicine

## 2023-11-18 DIAGNOSIS — Z1231 Encounter for screening mammogram for malignant neoplasm of breast: Secondary | ICD-10-CM

## 2023-11-19 DIAGNOSIS — R202 Paresthesia of skin: Secondary | ICD-10-CM | POA: Diagnosis not present

## 2023-11-19 DIAGNOSIS — R200 Anesthesia of skin: Secondary | ICD-10-CM | POA: Diagnosis not present

## 2023-12-16 DIAGNOSIS — E782 Mixed hyperlipidemia: Secondary | ICD-10-CM | POA: Diagnosis not present

## 2023-12-16 DIAGNOSIS — M51362 Other intervertebral disc degeneration, lumbar region with discogenic back pain and lower extremity pain: Secondary | ICD-10-CM | POA: Diagnosis not present

## 2023-12-16 DIAGNOSIS — M5442 Lumbago with sciatica, left side: Secondary | ICD-10-CM | POA: Diagnosis not present

## 2023-12-16 DIAGNOSIS — M546 Pain in thoracic spine: Secondary | ICD-10-CM | POA: Diagnosis not present

## 2023-12-16 DIAGNOSIS — M5413 Radiculopathy, cervicothoracic region: Secondary | ICD-10-CM | POA: Diagnosis not present

## 2023-12-16 DIAGNOSIS — E1159 Type 2 diabetes mellitus with other circulatory complications: Secondary | ICD-10-CM | POA: Diagnosis not present

## 2023-12-16 DIAGNOSIS — E1129 Type 2 diabetes mellitus with other diabetic kidney complication: Secondary | ICD-10-CM | POA: Diagnosis not present

## 2023-12-21 DIAGNOSIS — Z1331 Encounter for screening for depression: Secondary | ICD-10-CM | POA: Diagnosis not present

## 2023-12-21 DIAGNOSIS — I10 Essential (primary) hypertension: Secondary | ICD-10-CM | POA: Diagnosis not present

## 2023-12-21 DIAGNOSIS — S39012A Strain of muscle, fascia and tendon of lower back, initial encounter: Secondary | ICD-10-CM | POA: Diagnosis not present

## 2023-12-21 DIAGNOSIS — Z6832 Body mass index (BMI) 32.0-32.9, adult: Secondary | ICD-10-CM | POA: Diagnosis not present

## 2023-12-21 DIAGNOSIS — Z1231 Encounter for screening mammogram for malignant neoplasm of breast: Secondary | ICD-10-CM | POA: Diagnosis not present

## 2023-12-21 DIAGNOSIS — E782 Mixed hyperlipidemia: Secondary | ICD-10-CM | POA: Diagnosis not present

## 2023-12-21 DIAGNOSIS — E1129 Type 2 diabetes mellitus with other diabetic kidney complication: Secondary | ICD-10-CM | POA: Diagnosis not present

## 2024-01-20 DIAGNOSIS — M65312 Trigger thumb, left thumb: Secondary | ICD-10-CM | POA: Diagnosis not present

## 2024-01-20 DIAGNOSIS — M65311 Trigger thumb, right thumb: Secondary | ICD-10-CM | POA: Diagnosis not present

## 2024-01-20 DIAGNOSIS — R29898 Other symptoms and signs involving the musculoskeletal system: Secondary | ICD-10-CM | POA: Diagnosis not present

## 2024-01-20 DIAGNOSIS — R200 Anesthesia of skin: Secondary | ICD-10-CM | POA: Diagnosis not present

## 2024-01-20 DIAGNOSIS — R202 Paresthesia of skin: Secondary | ICD-10-CM | POA: Diagnosis not present

## 2024-01-20 DIAGNOSIS — R936 Abnormal findings on diagnostic imaging of limbs: Secondary | ICD-10-CM | POA: Diagnosis not present

## 2024-01-21 DIAGNOSIS — M5442 Lumbago with sciatica, left side: Secondary | ICD-10-CM | POA: Diagnosis not present

## 2024-01-21 DIAGNOSIS — M546 Pain in thoracic spine: Secondary | ICD-10-CM | POA: Diagnosis not present

## 2024-01-21 DIAGNOSIS — M5413 Radiculopathy, cervicothoracic region: Secondary | ICD-10-CM | POA: Diagnosis not present
